# Patient Record
Sex: Male | Born: 1970 | Hispanic: Yes | Marital: Married | Smoking: Former smoker
Health system: Southern US, Community
[De-identification: ages and names within clinical notes are randomized; demographics above are authoritative.]

## PROBLEM LIST (undated history)

## (undated) DIAGNOSIS — B9681 Helicobacter pylori [H. pylori] as the cause of diseases classified elsewhere: Secondary | ICD-10-CM

## (undated) DIAGNOSIS — K4090 Unilateral inguinal hernia, without obstruction or gangrene, not specified as recurrent: Secondary | ICD-10-CM

## (undated) DIAGNOSIS — K219 Gastro-esophageal reflux disease without esophagitis: Secondary | ICD-10-CM

## (undated) DIAGNOSIS — K2970 Gastritis, unspecified, without bleeding: Secondary | ICD-10-CM

## (undated) DIAGNOSIS — I10 Essential (primary) hypertension: Secondary | ICD-10-CM

## (undated) HISTORY — DX: Gastro-esophageal reflux disease without esophagitis: K21.9

## (undated) HISTORY — DX: Helicobacter pylori (H. pylori) as the cause of diseases classified elsewhere: B96.81

## (undated) HISTORY — DX: Gastritis, unspecified, without bleeding: K29.70

---

## 2010-09-26 ENCOUNTER — Emergency Department (HOSPITAL_COMMUNITY)
Admission: EM | Admit: 2010-09-26 | Discharge: 2010-09-27 | Disposition: A | Discharge status: Home / Self Care | Payer: Self-pay | Attending: Emergency Medicine | Admitting: Emergency Medicine

## 2010-09-26 DIAGNOSIS — Z79899 Other long term (current) drug therapy: Secondary | ICD-10-CM | POA: Insufficient documentation

## 2010-09-26 DIAGNOSIS — I10 Essential (primary) hypertension: Secondary | ICD-10-CM | POA: Insufficient documentation

## 2010-09-26 DIAGNOSIS — R079 Chest pain, unspecified: Secondary | ICD-10-CM | POA: Insufficient documentation

## 2010-09-26 LAB — DIFFERENTIAL
Basophils Absolute: 0 10*3/uL (ref 0.0–0.1)
Eosinophils Relative: 2 % (ref 0–5)
Lymphocytes Relative: 16 % (ref 12–46)
Lymphs Abs: 1.6 10*3/uL (ref 0.7–4.0)
Neutro Abs: 7.8 10*3/uL — ABNORMAL HIGH (ref 1.7–7.7)

## 2010-09-26 LAB — CBC
HCT: 45.3 % (ref 39.0–52.0)
Hemoglobin: 16.2 g/dL (ref 13.0–17.0)
MCV: 91.1 fL (ref 78.0–100.0)
RBC: 4.97 MIL/uL (ref 4.22–5.81)
RDW: 12.8 % (ref 11.5–15.5)
WBC: 10.3 10*3/uL (ref 4.0–10.5)

## 2010-09-26 LAB — POCT I-STAT TROPONIN I

## 2010-09-27 LAB — CK TOTAL AND CKMB (NOT AT ARMC)
CK, MB: 2.6 ng/mL (ref 0.3–4.0)
Relative Index: 1.7 (ref 0.0–2.5)
Total CK: 155 U/L (ref 7–232)

## 2010-09-27 LAB — COMPREHENSIVE METABOLIC PANEL
ALT: 52 U/L (ref 0–53)
AST: 38 U/L — ABNORMAL HIGH (ref 0–37)
Albumin: 4.2 g/dL (ref 3.5–5.2)
Alkaline Phosphatase: 64 U/L (ref 39–117)
BUN: 14 mg/dL (ref 6–23)
CO2: 22 mEq/L (ref 19–32)
Calcium: 9.3 mg/dL (ref 8.4–10.5)
Chloride: 106 mEq/L (ref 96–112)
Creatinine, Ser: <0.47 mg/dL — ABNORMAL LOW (ref 0.50–1.35)
Glucose, Bld: 109 mg/dL — ABNORMAL HIGH (ref 70–99)
Potassium: 3.8 mEq/L (ref 3.5–5.1)
Sodium: 138 mEq/L (ref 135–145)
Total Bilirubin: 0.7 mg/dL (ref 0.3–1.2)
Total Protein: 7.2 g/dL (ref 6.0–8.3)

## 2010-09-27 LAB — POCT I-STAT TROPONIN I: Troponin i, poc: 0 ng/mL (ref 0.00–0.08)

## 2010-09-29 ENCOUNTER — Emergency Department (HOSPITAL_COMMUNITY)
Admission: EM | Admit: 2010-09-29 | Discharge: 2010-09-30 | Disposition: A | Discharge status: Home / Self Care | Payer: Self-pay | Attending: Emergency Medicine | Admitting: Emergency Medicine

## 2010-09-29 DIAGNOSIS — R1013 Epigastric pain: Secondary | ICD-10-CM | POA: Insufficient documentation

## 2010-09-29 DIAGNOSIS — K219 Gastro-esophageal reflux disease without esophagitis: Secondary | ICD-10-CM | POA: Insufficient documentation

## 2010-09-29 DIAGNOSIS — K921 Melena: Secondary | ICD-10-CM | POA: Insufficient documentation

## 2010-09-29 DIAGNOSIS — I10 Essential (primary) hypertension: Secondary | ICD-10-CM | POA: Insufficient documentation

## 2010-09-29 LAB — URINALYSIS, ROUTINE W REFLEX MICROSCOPIC
Bilirubin Urine: NEGATIVE
Glucose, UA: NEGATIVE mg/dL
Hgb urine dipstick: NEGATIVE
Ketones, ur: NEGATIVE mg/dL
Nitrite: NEGATIVE
Protein, ur: NEGATIVE mg/dL
Specific Gravity, Urine: 1.019 (ref 1.005–1.030)
Urobilinogen, UA: 0.2 mg/dL (ref 0.0–1.0)
pH: 5.5 (ref 5.0–8.0)

## 2010-09-29 LAB — POCT I-STAT, CHEM 8
Chloride: 105 mEq/L (ref 96–112)
Creatinine, Ser: 0.6 mg/dL (ref 0.50–1.35)
Glucose, Bld: 99 mg/dL (ref 70–99)
HCT: 50 % (ref 39.0–52.0)
Hemoglobin: 17 g/dL (ref 13.0–17.0)
Potassium: 3.8 mEq/L (ref 3.5–5.1)
Sodium: 139 mEq/L (ref 135–145)

## 2010-09-29 LAB — POCT I-STAT TROPONIN I: Troponin i, poc: 0.01 ng/mL (ref 0.00–0.08)

## 2010-09-29 LAB — URINE MICROSCOPIC-ADD ON

## 2010-09-30 LAB — SAMPLE TO BLOOD BANK

## 2010-10-03 ENCOUNTER — Ambulatory Visit: Payer: Self-pay | Admitting: Internal Medicine

## 2011-02-23 ENCOUNTER — Ambulatory Visit: Payer: Self-pay

## 2011-02-23 DIAGNOSIS — F17200 Nicotine dependence, unspecified, uncomplicated: Secondary | ICD-10-CM

## 2011-02-23 DIAGNOSIS — R509 Fever, unspecified: Secondary | ICD-10-CM

## 2011-02-23 DIAGNOSIS — R05 Cough: Secondary | ICD-10-CM

## 2012-02-06 ENCOUNTER — Ambulatory Visit: Payer: Self-pay | Admitting: Family Medicine

## 2012-02-06 VITALS — BP 111/75 | HR 73 | Temp 98.5°F | Resp 16 | Ht 67.0 in | Wt 172.0 lb

## 2012-02-06 DIAGNOSIS — R109 Unspecified abdominal pain: Secondary | ICD-10-CM

## 2012-02-06 DIAGNOSIS — L408 Other psoriasis: Secondary | ICD-10-CM

## 2012-02-06 DIAGNOSIS — L409 Psoriasis, unspecified: Secondary | ICD-10-CM

## 2012-02-06 DIAGNOSIS — R1013 Epigastric pain: Secondary | ICD-10-CM

## 2012-02-06 LAB — GLUCOSE, POCT (MANUAL RESULT ENTRY): POC Glucose: 85 mg/dl (ref 70–99)

## 2012-02-06 MED ORDER — FLUOCINONIDE-E 0.05 % EX CREA: TOPICAL_CREAM | Freq: Two times a day (BID) | CUTANEOUS | Status: DC

## 2012-02-06 MED ORDER — DEXLANSOPRAZOLE 30 MG PO CPDR: 30.0000 mg | DELAYED_RELEASE_CAPSULE | Freq: Every day | ORAL | Status: DC

## 2012-02-06 NOTE — Progress Notes (Signed)
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  He has had nausea but no vomiting.  He has been tested and treated for H.Pylori in past with no improvement.  Objective:  NAD Psoriatic plaque right forearm, left chest  Abdominal exam:  Soft, mildly tender RLQ and epigastrium with umbilical hernia (1 cm), no masses or HSM Results for orders placed in visit on 02/06/12  GLUCOSE, POCT (MANUAL RESULT ENTRY)      Component Value Range   POC Glucose 85  70 - 99 mg/dl     Assessment:  Gastritis  Plan:  Dexilant 1. Abdominal pain  POCT glucose (manual entry)

## 2012-02-09 ENCOUNTER — Ambulatory Visit: Payer: Self-pay | Admitting: Family Medicine

## 2012-02-09 VITALS — BP 106/68 | HR 72 | Temp 98.2°F | Resp 18 | Ht 66.0 in | Wt 166.0 lb

## 2012-02-09 DIAGNOSIS — R51 Headache: Secondary | ICD-10-CM

## 2012-02-09 DIAGNOSIS — J329 Chronic sinusitis, unspecified: Secondary | ICD-10-CM

## 2012-02-09 MED ORDER — HYDROCODONE-ACETAMINOPHEN 5-500 MG PO TABS: 1.0000 | ORAL_TABLET | Freq: Three times a day (TID) | ORAL | Status: DC | PRN

## 2012-02-09 MED ORDER — SULFAMETHOXAZOLE-TRIMETHOPRIM 800-160 MG PO TABS: 1.0000 | ORAL_TABLET | Freq: Two times a day (BID) | ORAL | Status: DC

## 2012-02-09 NOTE — Patient Instructions (Addendum)
Sinusitis   (Sinusitis)  La sinusitis es el enrojecimiento, dolor e hinchazón (inflamación) de los senos paranasales. Los senos paranasales son bolsas de aire que se encuentran dentro de los huesos del rostro (por debajo de los ojos, en la mitad de la frente o por encima de los ojos). En los senos paranasales sanos, el moco es capaz de drenar y el aire circula a través de ellos en su camino hacia la nariz. Sin embargo, cuando se inflaman, el moco y el aire quedan atrapados. Esto hace que se desarrollen bacterias y otros gérmenes y originen una infección.    La sinusitis puede desarrollarse rápidamente y durar sólo un tiempo corto (aguda) o continuar por un período largo (crónica).. La sinusitis que dura más de 12 semanas se considera crónica.   CAUSAS   Las causas de la sinusitis son:   · Alergias.  · Las anomalías estructurales, como el desplazamiento del cartílago que separa las fosas nasales (desvío del tabique) pueden disminuir el flujo de aire por la nariz y los senos paranasales y afectar su drenaje.  · Las alteraciones funcionales, como cuando los pequeños pelos (cilias) que se encuentran en los senos nasales y ayudan a eliminar la mucosidad no funcionan correctamente o no están presentes.  SÍNTOMAS   Los síntomas de sinusitis aguda y crónica son los mismos. Los síntomas principales son el dolor y la presión alrededor de los senos paranasales afectados. Otros síntomas son:   · Dolor en los dientes superiores.  · Dolor de oídos  · Dolor de cabeza.  · Mal aliento.  · Disminución del sentido del olfato y del gusto.  · Tos, que empeora al acostarse.  · Fatiga.  · Fiebre.  · Drenaje de moco espeso por la nariz, que generalmente es de color verde y puede contener pus (purulento).  · Hinchazón y calor en los senos paranasales afectados.  DIAGNÓSTICO   El médico le hará un examen físico. Durante el examen, el médico:   · Revisará su nariz buscando signos de crecimientos anormales en las fosas nasales (pólipos  nasales).  · Palpará los senos paranasales afectados para buscar signos de infección.  · Observará el interior de los senos paranasales (endoscopía) con un dispositivo luminoso especial (endoscopio) con el que tomará imágenes insertándolo en los senos paranasales.  Si el médico sospecha que usted sufre sinusitis crónica, podrá indicar una o más de las siguientes pruebas:   · Pruebas de alergia.  · Cultivo de secreciones nasales: tomará una muestra del moco nasal y lo enviará a un laboratorio para detectar bacterias.  · Citología nasal: el médico tomará una muestra de moco de la nariz para determinar si la sinusitis que usted sufre está relacionada con una alergia.  TRATAMIENTO   La mayoría de los casos de sinusitis aguda se deben a una infección viral y se resuelven espontáneamente dentro de los 10 días. En algunos casos se recetan medicamentos para aliviar los síntomas (analgésicos, descongestivos, aerosoles nasales con corticoides o aerosoles salinos).   Sin embargo, para la sinusitis por infección bacteriana, el médico le recetará antibióticos. Los antibióticos son medicamentos que destruyen las bacterias que causan la infección.   Rara vez la sinusitis tiene su origen en una infección por hongos. . En estos casos, el médico le recetará un medicamento antifúngico.   Para algunos casos de sinusitis crónica, es necesario someterse a una cirugía. Generalmente se trata de casos en los que la sinusitis se repite más de 3 veces al año, a pesar de   otros tratamientos.   INSTRUCCIONES PARA EL CUIDADO EN EL HOGAR   · Beba gran cantidad de líquidos. Los líquidos ayudan a disolver el moco para que drene más fácilmente de los senos paranasales.  · Use un humidificador.  · Inhale vapor de 3 a 4 veces al día (por ejemplo, siéntese en el baño con la ducha abierta).  · Aplique un paño tibio y húmedo en el rostro 3 ó 4 veces al día, o según las indicaciones de su médico.  · Use un aerosol nasal salino para ayudar a humedecer y  limpiar los senos nasales.  · Tome medicamentos de venta libre o recetados para el aliviar el dolor, el malestar o la fiebre sólo según las indicaciones de su médico.  SOLICITE ATENCIÓN MÉDICA DE INMEDIATO SI:   · Siente más dolor o sufre dolores de cabeza intensos.  · Tiene náuseas, vómitos o somnolencia.  · Observa hinchazón alrededor del rostro.  · Tiene problemas de visión.  · Presenta rigidez en el cuello.  · Tiene dificultad para respirar.  ASEGÚRESE DE QUE:   · Comprende estas instrucciones.  · Controlará su enfermedad.  · Solicitará ayuda de inmediato si no mejora o si empeora.  Document Released: 10/23/2004 Document Revised: 04/07/2011  Exit[employer]® Patient Information ©2013 [employer].

## 2012-02-09 NOTE — Progress Notes (Signed)
Patient ID: Tran Arzuaga MRN: [id number], DOB: [birth year], 42 y.o. Date of Encounter: 02/09/2012, 7:13 PM  Primary Physician: No primary provider on file.  Chief Complaint:  Chief Complaint  Patient presents with  . Headache    3 days ago  . Neck Pain    HPI: 42 y.o. year old male presents with 14 day history of nasal congestion, post nasal drip, sore throat, sinus pressure, and cough. Afebrile. No chills. Nasal congestion thick and green/yellow. Sinus pressure is the worst symptom. Cough is productive secondary to post nasal drip and not associated with time of day. Ears feel full, leading to sensation of muffled hearing. Has tried OTC cold preps without success. No GI complaints. Also has bad breath  No recent antibiotics, recent travels, or sick contacts   No leg trauma, sedentary periods, h/o cancer, or tobacco use.  No past medical history on file.   Home Meds: Prior to Admission medications   Medication Sig Start Date End Date Taking? Authorizing Provider  Dexlansoprazole (DEXILANT) 30 MG capsule Take 1 capsule (30 mg total) by mouth daily. 02/06/12  Yes Elvina Sidle, MD  fluocinonide-emollient (LIDEX-E) 0.05 % cream Apply topically 2 (two) times daily. 02/06/12  Yes Elvina Sidle, MD  HYDROcodone-acetaminophen (VICODIN) 5-500 MG per tablet Take 1 tablet by mouth every 8 (eight) hours as needed for pain. 02/09/12   Elvina Sidle, MD  sulfamethoxazole-trimethoprim (BACTRIM DS,SEPTRA DS) 800-160 MG per tablet Take 1 tablet by mouth 2 (two) times daily. 02/09/12   Elvina Sidle, MD    Allergies: No Known Allergies  History   Social History  . Marital Status: Married    Spouse Name: N/A    Number of Children: N/A  . Years of Education: N/A   Occupational History  . Not on file.   Social History Main Topics  . Smoking status: Current Every Day Smoker -- 0.5 packs/day for 10 years  . Smokeless tobacco: Not on file  . Alcohol Use: Yes  . Drug Use: No  .  Sexually Active: Not on file   Other Topics Concern  . Not on file   Social History Narrative  . No narrative on file     Review of Systems: Constitutional: negative for chills, fever, night sweats or weight changes Cardiovascular: negative for chest pain or palpitations Respiratory: negative for hemoptysis, wheezing, or shortness of breath Abdominal: negative for abdominal pain, nausea, vomiting or diarrhea Dermatological: negative for rash Neurologic: positive for pressure headache and neck pain with some chronic swollen post cervical cyst like swellings   Physical Exam: Blood pressure 106/68, pulse 72, temperature 98.2 F (36.8 C), temperature source Oral, resp. rate 18, height 5\' 6"  (1.676 m), weight 166 lb (75.297 kg), SpO2 97.00%., Body mass index is 26.79 kg/(m^2). General: Well developed, well nourished, in no acute distress. Head: Normocephalic, atraumatic, eyes without discharge, sclera non-icteric, nares are congested. Bilateral auditory canals clear, TM's are without perforation, pearly grey with reflective cone of light bilaterally. Serous effusion bilaterally behind TM's. Maxillary sinus TTP. Oral cavity moist, dentition normal. Posterior pharynx with post nasal drip and mild erythema. No peritonsillar abscess or tonsillar exudate. Neck: Supple. No thyromegaly. Full ROM. No lymphadenopathy. Lungs: Clear bilaterally to auscultation without wheezes, rales, or rhonchi. Breathing is unlabored.  Heart: RRR with S1 S2. No murmurs, rubs, or gallops appreciated. Msk:  Strength and tone normal for age. Extremities: No clubbing or cyanosis. No edema. Neuro: Alert and oriented X 3. Moves all extremities spontaneously. CNII-XII grossly  in tact. Psych:  Responds to questions appropriately with a normal affect.   Labs:   ASSESSMENT AND PLAN:  42 y.o. year old male with sinusitis Septra ds bid x 10 days vicodin for hs pain -  -Tylenol/Motrin prn -Rest/fluids -RTC  precautions -RTC 3-5 days if no improvement  Signed, Elvina Sidle, MD 02/09/2012 7:13 PM

## 2012-02-20 ENCOUNTER — Encounter: Payer: Self-pay | Admitting: Internal Medicine

## 2012-02-20 ENCOUNTER — Telehealth: Payer: Self-pay

## 2012-03-15 ENCOUNTER — Encounter: Payer: Self-pay | Admitting: Internal Medicine

## 2012-03-16 ENCOUNTER — Encounter: Payer: Self-pay | Admitting: Internal Medicine

## 2012-03-16 ENCOUNTER — Other Ambulatory Visit (INDEPENDENT_AMBULATORY_CARE_PROVIDER_SITE_OTHER): Payer: Self-pay

## 2012-03-16 ENCOUNTER — Ambulatory Visit (INDEPENDENT_AMBULATORY_CARE_PROVIDER_SITE_OTHER): Payer: Self-pay | Admitting: Internal Medicine

## 2012-03-16 ENCOUNTER — Other Ambulatory Visit: Payer: Self-pay | Admitting: Internal Medicine

## 2012-03-16 VITALS — BP 132/94 | HR 76 | Ht 66.14 in | Wt 171.5 lb

## 2012-03-16 DIAGNOSIS — L409 Psoriasis, unspecified: Secondary | ICD-10-CM

## 2012-03-16 DIAGNOSIS — R17 Unspecified jaundice: Secondary | ICD-10-CM

## 2012-03-16 DIAGNOSIS — R110 Nausea: Secondary | ICD-10-CM

## 2012-03-16 DIAGNOSIS — Z8619 Personal history of other infectious and parasitic diseases: Secondary | ICD-10-CM

## 2012-03-16 DIAGNOSIS — L408 Other psoriasis: Secondary | ICD-10-CM

## 2012-03-16 DIAGNOSIS — R1013 Epigastric pain: Secondary | ICD-10-CM | POA: Insufficient documentation

## 2012-03-16 DIAGNOSIS — R109 Unspecified abdominal pain: Secondary | ICD-10-CM

## 2012-03-16 DIAGNOSIS — K3189 Other diseases of stomach and duodenum: Secondary | ICD-10-CM

## 2012-03-16 LAB — COMPREHENSIVE METABOLIC PANEL
AST: 29 U/L (ref 0–37)
Alkaline Phosphatase: 61 U/L (ref 39–117)
BUN: 12 mg/dL (ref 6–23)
Creatinine, Ser: 0.6 mg/dL (ref 0.4–1.5)
Glucose, Bld: 95 mg/dL (ref 70–99)
Total Bilirubin: 2.7 mg/dL — ABNORMAL HIGH (ref 0.3–1.2)

## 2012-03-16 LAB — CBC
HCT: 50.1 % (ref 39.0–52.0)
MCHC: 33.9 g/dL (ref 30.0–36.0)
MCV: 93.7 fl (ref 78.0–100.0)
Platelets: 162 10*3/uL (ref 150.0–400.0)
RBC: 5.35 Mil/uL (ref 4.22–5.81)

## 2012-03-16 LAB — LIPASE: Lipase: 23 U/L (ref 11.0–59.0)

## 2012-03-16 MED ORDER — FLUOCINONIDE-E 0.05 % EX CREA: TOPICAL_CREAM | Freq: Two times a day (BID) | CUTANEOUS | Status: DC

## 2012-03-16 MED ORDER — SUCRALFATE 1 G PO TABS: 1.0000 g | ORAL_TABLET | Freq: Four times a day (QID) | ORAL | Status: DC

## 2012-03-16 MED ORDER — DEXLANSOPRAZOLE 30 MG PO CPDR: 60.0000 mg | DELAYED_RELEASE_CAPSULE | Freq: Every day | ORAL | Status: DC

## 2012-03-16 NOTE — Progress Notes (Signed)
Patient ID: Caleb Pace, male   DOB: 1970/03/05, 42 y.o.   MRN: [id number]  SUBJECTIVE: HPI Caleb Pace is a 42 yo Hispanic male with a past medical history of GERD and Helicobacter pylori who seen in consultation for evaluation of epigastric abdominal pain, nausea and heartburn. He is accompanied today by his daughter who helps with Spanish translation and also a medical interpreter.  The patient reports 4 months of epigastric abdominal pain which is moderate to at times severe. Occasionally he reports pain in the left upper quadrant. This seems to be worse in the morning, and then one to 2 hours after eating. He feels that eating briefly helps with the burning pain, but it returns after an hour or so. He was started on Dexilant 30 mg daily approximately one month ago, which he has been taking as prescribed. He's had no benefit with the addition of this medication. He notes significant nausea but no vomiting.  He does recall a history of H. pylori diagnosed 2 years ago by blood test and he was treated with antibiotics for 2 weeks. He reports his current symptoms do not feel exactly like his symptoms around the time of treatment.  The difference is the quality of his current pain is more burning than before. Occasionally he is using baking soda with water which he says helps. He does have some nocturnal symptoms. He also endorses early satiety with an approximate 10 pound weight loss. There is some report of possible black stools about 2 weeks ago but this resolved. Occasionally he feels like food sticks as he swallows he said no food impaction. He has no trouble swallowing liquids. No fevers or chills. No frequent NSAID use.  Review of Systems  As per history of present illness, otherwise negative   Past Medical History  Diagnosis Date  . GERD (gastroesophageal reflux disease)   . Helicobacter pylori gastritis     Current Outpatient Prescriptions  Medication Sig Dispense Refill  .  Dexlansoprazole (DEXILANT) 30 MG capsule Take 2 capsules (60 mg total) by mouth daily.  30 capsule  10  . fluocinonide-emollient (LIDEX-E) 0.05 % cream Apply topically 2 (two) times daily.  60 g  6  . sucralfate (CARAFATE) 1 G tablet Take 1 tablet (1 g total) by mouth 4 (four) times daily.  120 tablet  1   No current facility-administered medications for this visit.    Allergies  Allergen Reactions  . Asa (Aspirin)     Family History  Problem Relation Age of Onset  . Family history unknown: Yes    History  Substance Use Topics  . Smoking status: Current Every Day Smoker -- 0.50 packs/day for 10 years    Types: Cigarettes  . Smokeless tobacco: Never Used  . Alcohol Use: Yes    OBJECTIVE: BP 132/94  Pulse 76  Ht 5' 6.14" (1.68 m)  Wt 171 lb 8 oz (77.792 kg)  BMI 27.56 kg/m2 Constitutional: Well-developed and well-nourished. No distress. HEENT: Normocephalic and atraumatic. Oropharynx is clear and moist. No oropharyngeal exudate. Conjunctivae are normal. No scleral icterus. Neck: Neck supple. Trachea midline. Cardiovascular: Normal rate, regular rhythm and intact distal pulses. No M/R/G Pulmonary/chest: Effort normal and breath sounds normal. No wheezing, rales or rhonchi. Abdominal: Soft,  Epigastric abdominal tenderness without rebound or guarding , nondistended. Bowel sounds active throughout. There are no masses palpable. No hepatosplenomegaly. Extremities: no clubbing, cyanosis, or edema Lymphadenopathy: No cervical adenopathy noted. Neurological: Alert and oriented to person place and time. Skin:  Skin is warm and dry. No rashes noted. Psychiatric: Normal mood and affect. Behavior is normal.  Labs - ordered today  ASSESSMENT AND PLAN: 42 yo Hispanic male with a past medical history of GERD and Helicobacter pylori who seen in consultation for evaluation of epigastric abdominal pain, nausea and heartburn.   1.  Ulcer-like dyspepsia/epigastric pain/nausea -- the  patient's symptoms have not responded to Dexilant 30 mg daily, and I recommended upper endoscopy for further evaluation. His symptoms could be consistent with ulcer disease, but I would also like to rule out recurrent H. pylori infection. I have increased his Dexilant 60 mg daily and add Carafate 1 g 3 times a day a.c. and at bedtime. I've asked that he continue to avoid NSAIDs. He voices understanding. Further recommendations to be made after the upper endoscopy.  2.  Psoriasis -- he requests a refill of Lidex-E cream, this will be refilled once for him to further refills should come from his primary care or dermatologist.

## 2012-03-16 NOTE — Patient Instructions (Addendum)
You have been given a separate informational sheet regarding your tobacco use, the importance of quitting and local resources to help you quit. You have been scheduled for an endoscopy with propofol. Please follow written instructions given to you at your visit today. If you use inhalers (even only as needed) or a CPAP machine, please bring them with you on the day of your procedure.   We have sent the following medications to your pharmacy for you to pick up at your convenience: Dexilant; please take 60mg  until your procedure.  Carafate; please take as directed.  AINES y lceras ppticas (NSAIDs and Peptic Ulcers) Una lcera pptica es un defecto que se forma en el revestimiento del estmago o la primera porcin del intestino delgado.  CAUSAS La causa de la mayor parte de las lceras ppticas es la bacteria H. Pylori (Helicobacter pylori). Algunas lceras ppticas estn causada por el uso prolongado de AINES (drogas antiinflamatorias no esteroideas). Entre los AINES se incluyen la aspirina, el ibuprofeno y el naproxeno sodico. SNTOMAS Una lcera puede ocasionar:  Dolor y ardor persistente en la regin superior del abdomen.  Nuseas.  Vmitos.  Prdida del apetito.  Prdida de peso.  Fatiga. Normalmente el estmago tiene tres defensas contra los jugos digestivos:   El mucus que recubre las paredes del Teaching laboratory technician y lo protege del cido Data processing manager.  El bicarbonato qumico que neutraliza el cido Data processing manager.  La circulacin sangunea hacia las paredes del estmago que ayudan en la renovacin y reparacin de clulas. Las AINES entorpecen todos estos mecanismos protectores. Con las defensas del [address], los jugos digestivos pueden daar las paredes del [place] y Counsellor. TRATAMIENTO  Las lceras inducidas por AINES suelen curarse una vez que la persona deja de tomar la medicacin. El mdico podr recomendarle tomar anticidos para Hydrologist. Los anticidos  [employer] de curacin y [address] sntomas. Las drogas denominadas bloqueadores H2 o los inhibidores de la bomba de protones disminuyen la cantidad de cido del [place].  Si una persona con una lcera poe AINEs tambin da positivo para el H. pylori, deber tratarse con antibiticos para eliminar la bacteria.  Podr ser Lois Huxley ciruga si la lcera reaparece o no se cura bien.  Tambin se puede requerir ciruga si aparecen complicaciones como:  Hemorragia grave.  Perforacin.  Obstruccin.  Cualquier persona que consume AINES y experimenta sntomas de lcera pptica deber consultar con el mdico para realizar el tratamiento adecuado. El retardo del diagnstico y el tratamiento puede llevar a complicaciones y a la necesidad de [place]. [facility] ADICIONAL [employer] Digestive Diseases Information Clearinghouse [facility] para la Informacin Bonnie Digestivas): http://[email or web address].co.[email or web address] Document Released: 05/01/2008 Document Revised: 04/07/2011 [facility] Patient Information 2013 [place], [place].

## 2012-03-18 ENCOUNTER — Telehealth: Payer: Self-pay | Admitting: Internal Medicine

## 2012-03-18 ENCOUNTER — Ambulatory Visit (HOSPITAL_COMMUNITY)
Admission: RE | Admit: 2012-03-18 | Discharge: 2012-03-18 | Disposition: A | Discharge status: Home / Self Care | Payer: Self-pay | Source: Ambulatory Visit | Attending: Internal Medicine | Admitting: Internal Medicine

## 2012-03-18 DIAGNOSIS — R110 Nausea: Secondary | ICD-10-CM | POA: Insufficient documentation

## 2012-03-18 DIAGNOSIS — R17 Unspecified jaundice: Secondary | ICD-10-CM | POA: Insufficient documentation

## 2012-03-18 NOTE — Telephone Encounter (Addendum)
Informed pt of normal u/s. His niece called and I informed her he needs to sign papers so I can speak with her. He was sitting next to her and I informed him everything is OK and she will tell him in [place]. She/he wants to know about the liver, since the test was OK. Informed her sometimes the lab will clear up on another blood draw, but the U/S showed everything was OK. She will remind him of his procedure on 03/22/12.

## 2012-03-22 ENCOUNTER — Telehealth: Payer: Self-pay | Admitting: Internal Medicine

## 2012-03-22 ENCOUNTER — Other Ambulatory Visit (INDEPENDENT_AMBULATORY_CARE_PROVIDER_SITE_OTHER): Payer: Self-pay

## 2012-03-22 ENCOUNTER — Ambulatory Visit (AMBULATORY_SURGERY_CENTER): Payer: Self-pay | Admitting: Internal Medicine

## 2012-03-22 ENCOUNTER — Encounter: Payer: Self-pay | Admitting: Internal Medicine

## 2012-03-22 VITALS — BP 120/80 | HR 62 | Resp 15 | Ht 66.0 in | Wt 171.0 lb

## 2012-03-22 DIAGNOSIS — K2970 Gastritis, unspecified, without bleeding: Secondary | ICD-10-CM

## 2012-03-22 DIAGNOSIS — L409 Psoriasis, unspecified: Secondary | ICD-10-CM

## 2012-03-22 DIAGNOSIS — R110 Nausea: Secondary | ICD-10-CM

## 2012-03-22 DIAGNOSIS — R109 Unspecified abdominal pain: Secondary | ICD-10-CM

## 2012-03-22 DIAGNOSIS — R1013 Epigastric pain: Secondary | ICD-10-CM

## 2012-03-22 DIAGNOSIS — A048 Other specified bacterial intestinal infections: Secondary | ICD-10-CM

## 2012-03-22 DIAGNOSIS — Z8619 Personal history of other infectious and parasitic diseases: Secondary | ICD-10-CM

## 2012-03-22 DIAGNOSIS — K2990 Gastroduodenitis, unspecified, without bleeding: Secondary | ICD-10-CM

## 2012-03-22 LAB — HEPATIC FUNCTION PANEL
ALT: 32 U/L (ref 0–53)
AST: 21 U/L (ref 0–37)
Bilirubin, Direct: 0.2 mg/dL (ref 0.0–0.3)
Total Protein: 6.5 g/dL (ref 6.0–8.3)

## 2012-03-22 MED ORDER — DEXLANSOPRAZOLE 60 MG PO CPDR: 60.0000 mg | DELAYED_RELEASE_CAPSULE | Freq: Every day | ORAL | Status: DC

## 2012-03-22 MED ORDER — FLUOCINONIDE-E 0.05 % EX CREA
TOPICAL_CREAM | Freq: Two times a day (BID) | CUTANEOUS | Status: DC
Start: 2012-03-22 — End: 2012-04-06

## 2012-03-22 MED ORDER — SODIUM CHLORIDE 0.9 % IV SOLN: 500.0000 mL | INTRAVENOUS | Status: DC

## 2012-03-22 MED ORDER — SUCRALFATE 1 G PO TABS: 1.0000 g | ORAL_TABLET | Freq: Four times a day (QID) | ORAL | Status: DC

## 2012-03-22 NOTE — Op Note (Signed)
[facility] [address] [place], [address]   ENDOSCOPY PROCEDURE REPORT  PATIENT: Caleb Pace, Caleb Pace  MR#: [id number] BIRTHDATE: Oct 20, 1970 , 42  yrs. old GENDER: Male ENDOSCOPIST: Beverley Fiedler, MD PROCEDURE DATE:  03/22/2012 PROCEDURE:  [Referring Physician] ASA CLASS:     Class II INDICATIONS:  Nausea.   Epigastric pain.   Dyspepsia. MEDICATIONS: MAC sedation, administered by CRNA, propofol (Diprivan) 200mg  IV, and Glycopyrrolate (Robinul) 0.2 mg IV TOPICAL ANESTHETIC: Cetacaine Spray  DESCRIPTION OF PROCEDURE: After the risks benefits and alternatives of the procedure were thoroughly explained, informed consent was obtained.  The LB GIF-H180 [id number] endoscope was introduced through the mouth and advanced to the second portion of the duodenum. Without limitations.  The instrument was slowly withdrawn as the mucosa was fully examined.     ESOPHAGUS: An irregular Z-line was observed 40 cm from the incisors. Multiple biopsies were performed using cold forceps.  Sample obtained to rule out Barrett's esophagus.   The esophagus was otherwise normal.  STOMACH: Mild gastritis (inflammation) with scattered erosions was found in the entire examined stomach.  Multiple biopsies were performed using cold forceps.  Sample obtained for helicobacter pylori testing.  DUODENUM: Moderate duodenal inflammation characterized by erythema and edema was found in the duodenal bulb.   The duodenal mucosa showed no abnormalities in the 2nd part of the duodenum.  Retroflexed views revealed no abnormalities.     The scope was then withdrawn from the patient and the procedure completed.  COMPLICATIONS: There were no complications.  ENDOSCOPIC IMPRESSION: 1.   Irregular Z-line was observed 40 cm from the incisors; multiple biopsies 2.   The esophagus was otherwise normal. 3.   Gastritis (inflammation) was found in the entire examined stomach; multiple biopsies 4.   Duodenal  inflammation was found in the duodenal bulb 5.   The duodenal mucosa showed no abnormalities in the 2nd part of the duodenum     RECOMMENDATIONS: 1.  Await pathology results 2.  Follow-up of helicobacter pylori status, treat if indicated 3.  Avoid NSAIDs 4.  Continue Dexilant 60 mg daily 5.  Start Carafate 1 gram three times daily before meals and at bedtime  eSigned:  Beverley Fiedler, MD 03/22/2012 10:11 AM   CC:The Patient  PATIENT NAME:  Caleb Pace, Caleb Pace MR#: [id number]

## 2012-03-22 NOTE — Progress Notes (Addendum)
Patient did not experience any of the following events: a burn prior to discharge; a fall within the facility; wrong site/side/patient/procedure/implant event; or a hospital transfer or hospital admission upon discharge from the facility. ([id number])Patient did not have preoperative order for IV antibiotic SSI prophylaxis. [phone]) ewm  Pt to go to lab prior to home per orders dr pyrtle. ewm

## 2012-03-22 NOTE — Telephone Encounter (Signed)
Pt had called [facility] pharmacy and his prescription was not there. Medicines resent to [facility] as pt changed the pharmacy at the end of his procedure today in recovery and pt called and made aware resent medications and they should be ready in several hours. Pt returned verbal understanding of this. ewm

## 2012-03-22 NOTE — Patient Instructions (Addendum)
YOU HAD AN ENDOSCOPIC PROCEDURE TODAY AT [facility]: Refer to the procedure report that was given to you for any specific questions about what was found during the examination.  If the procedure report does not answer your questions, please call your gastroenterologist to clarify.  If you requested that your care partner not be given the details of your procedure findings, then the procedure report has been included in a sealed envelope for you to review at your convenience later.  YOU SHOULD EXPECT: Some feelings of bloating in the abdomen. Passage of more gas than usual.  Walking can help get rid of the air that was put into your GI tract during the procedure and reduce the bloating. If you had a lower endoscopy (such as a colonoscopy or flexible sigmoidoscopy) you may notice spotting of blood in your stool or on the toilet paper. If you underwent a bowel prep for your procedure, then you may not have a normal bowel movement for a few days.  DIET: Your first meal following the procedure should be a light meal and then it is ok to progress to your normal diet.  A half-sandwich or bowl of soup is an example of a good first meal.  Heavy or fried foods are harder to digest and may make you feel nauseous or bloated.  Likewise meals heavy in dairy and vegetables can cause extra gas to form and this can also increase the bloating.  Drink plenty of fluids but you should avoid alcoholic beverages for 24 hours.  ACTIVITY: Your care partner should take you home directly after the procedure.  You should plan to take it easy, moving slowly for the rest of the day.  You can resume normal activity the day after the procedure however you should NOT DRIVE or use heavy machinery for 24 hours (because of the sedation medicines used during the test).    SYMPTOMS TO REPORT IMMEDIATELY: A gastroenterologist can be reached at any hour.  During normal business hours, 8:30 AM to 5:00 PM Monday through Friday,  call [phone].  After hours and on weekends, please call the GI answering service at [phone] emergency number  who will take a message and have the physician on call contact you.   Following upper endoscopy (EGD)  Vomiting of blood or coffee ground material  New chest pain or pain under the shoulder blades  Painful or persistently difficult swallowing  New shortness of breath  Fever of 100F or higher  Black, tarry-looking stools  FOLLOW UP: If any biopsies were taken you will be contacted by phone or by letter within the next 1-3 weeks.  Call your gastroenterologist if you have not heard about the biopsies in 3 weeks.  Our staff will call the home number listed on your records the next business day following your procedure to check on you and address any questions or concerns that you may have at that time regarding the information given to you following your procedure. This is a courtesy call and so if there is no answer at the home number and we have not heard from you through the emergency physician on call, we will assume that you have returned to your regular daily activities without incident.  SIGNATURES/CONFIDENTIALITY: You and/or your care partner have signed paperwork which will be entered into your electronic medical record.  These signatures attest to the fact that that the information above on your After Visit Summary has been reviewed and is understood.  Full responsibility of the confidentiality of this discharge information lies with you and/or your care-partner.  Avoid all NSAIDS like motrin, aleve, advil or any prescription medicines that are NSAIDS Continue dexilant 60 mg daily Start carafate 1 gram 3 times a day before meals and at bedtime.

## 2012-03-23 ENCOUNTER — Telehealth: Payer: Self-pay | Admitting: *Deleted

## 2012-03-23 NOTE — Telephone Encounter (Signed)
  Follow up Call-  Call back number 03/22/2012  Post procedure Call Back phone  # [phone]  Permission to leave phone message Yes     Patient questions:  Do you have a fever, pain , or abdominal swelling? no Pain Score  0 *  Have you tolerated food without any problems? yes  Have you been able to return to your normal activities? yes  Do you have any questions about your discharge instructions: Diet   no Medications  no Follow up visit  no  Do you have questions or concerns about your Care? no  Actions: * If pain score is 4 or above: No action needed, pain <4.

## 2012-03-23 NOTE — Telephone Encounter (Signed)
Message copied by Florene Glen on Tue Mar 23, 2012  6:04 PM ------      Message from: Beverley Fiedler      Created: Mon Mar 22, 2012  3:17 PM       Bilirubin improved from late last week, nearly normal now.  Unclear reason for elevation, with normal liver and GB US last week.      Repeat hepatic function panel in 1 month to ensure complete normalization ------

## 2012-03-23 NOTE — Telephone Encounter (Signed)
Informed pt of improved Bilirubin; not sure if he understood, so he will have his niece call me. We will repeat labs in 1 month.

## 2012-03-24 ENCOUNTER — Telehealth: Payer: Self-pay | Admitting: *Deleted

## 2012-03-24 DIAGNOSIS — R17 Unspecified jaundice: Secondary | ICD-10-CM

## 2012-03-24 NOTE — Telephone Encounter (Signed)
Informed niece who speaks English of Bilirubin improvement. I will call her in 1 month to redraw hepatic panel to ensure bilirubin remains normal; [place] stated understanding. Reminder in.

## 2012-03-29 ENCOUNTER — Encounter: Payer: Self-pay | Admitting: Internal Medicine

## 2012-03-30 ENCOUNTER — Telehealth: Payer: Self-pay | Admitting: *Deleted

## 2012-03-30 NOTE — Telephone Encounter (Signed)
Letter from: Beverley Fiedler   Reason for Letter: Results Review   H. pylori positive, please treat with Pylera.  When you call the patient he will need Spanish interpreter assistance to be sure he understands the recommendations/medications.  PPI needs to eat twice daily during H. pylori treatment unless he is on Dexilant 60 mg daily.  Office followup 1 month after treatment      Let pt's niece, Lorie Phenix [phone], of pt's infection. We are trying to get him samples from the drug rep, but when I get them, I will call the pt with a Translator.

## 2012-03-31 MED ORDER — BIS SUBCIT-METRONID-TETRACYC 140-125-125 MG PO CAPS: 3.0000 | ORAL_CAPSULE | Freq: Three times a day (TID) | ORAL | Status: DC

## 2012-03-31 NOTE — Telephone Encounter (Signed)
Obtained an interpreter from, [phone], and called pt. Informed him about H.Pylori and the need for antibiotics. Gave instructions for the meds and an appt for April. He will call for problems or questions.

## 2012-04-01 NOTE — Telephone Encounter (Signed)
Niece called back and was informed. Pt later dx with H. Pylori and is being tx.

## 2012-04-06 ENCOUNTER — Telehealth: Payer: Self-pay | Admitting: Internal Medicine

## 2012-04-06 ENCOUNTER — Telehealth: Payer: Self-pay | Admitting: Cardiovascular Disease

## 2012-04-06 ENCOUNTER — Encounter: Payer: Self-pay | Admitting: Internal Medicine

## 2012-04-06 ENCOUNTER — Encounter (HOSPITAL_COMMUNITY): Payer: Self-pay | Admitting: Emergency Medicine

## 2012-04-06 ENCOUNTER — Emergency Department (HOSPITAL_COMMUNITY)
Admission: EM | Admit: 2012-04-06 | Discharge: 2012-04-06 | Disposition: A | Discharge status: Home / Self Care | Payer: Self-pay | Attending: Emergency Medicine | Admitting: Emergency Medicine

## 2012-04-06 ENCOUNTER — Emergency Department (HOSPITAL_COMMUNITY): Payer: Self-pay

## 2012-04-06 ENCOUNTER — Ambulatory Visit (INDEPENDENT_AMBULATORY_CARE_PROVIDER_SITE_OTHER): Payer: Self-pay | Admitting: Internal Medicine

## 2012-04-06 VITALS — BP 110/78 | HR 76 | Ht 66.0 in | Wt 172.0 lb

## 2012-04-06 DIAGNOSIS — R002 Palpitations: Secondary | ICD-10-CM | POA: Insufficient documentation

## 2012-04-06 DIAGNOSIS — Z79899 Other long term (current) drug therapy: Secondary | ICD-10-CM | POA: Insufficient documentation

## 2012-04-06 DIAGNOSIS — R42 Dizziness and giddiness: Secondary | ICD-10-CM

## 2012-04-06 DIAGNOSIS — K219 Gastro-esophageal reflux disease without esophagitis: Secondary | ICD-10-CM

## 2012-04-06 DIAGNOSIS — A048 Other specified bacterial intestinal infections: Secondary | ICD-10-CM

## 2012-04-06 DIAGNOSIS — F17200 Nicotine dependence, unspecified, uncomplicated: Secondary | ICD-10-CM | POA: Insufficient documentation

## 2012-04-06 LAB — CBC
HCT: 45.3 % (ref 39.0–52.0)
MCV: 91.5 fL (ref 78.0–100.0)
RBC: 4.95 MIL/uL (ref 4.22–5.81)
WBC: 7 10*3/uL (ref 4.0–10.5)

## 2012-04-06 LAB — BASIC METABOLIC PANEL
BUN: 10 mg/dL (ref 6–23)
CO2: 23 mEq/L (ref 19–32)
Chloride: 104 mEq/L (ref 96–112)
Creatinine, Ser: 0.79 mg/dL (ref 0.50–1.35)

## 2012-04-06 LAB — POCT I-STAT TROPONIN I: Troponin i, poc: 0 ng/mL (ref 0.00–0.08)

## 2012-04-06 MED ORDER — ZOLPIDEM TARTRATE 5 MG PO TABS: 5.0000 mg | ORAL_TABLET | Freq: Every evening | ORAL | Status: DC | PRN

## 2012-04-06 MED ORDER — OMEPRAZOLE 40 MG PO CPDR: 40.0000 mg | DELAYED_RELEASE_CAPSULE | Freq: Every day | ORAL | Status: DC

## 2012-04-06 MED ORDER — CLARITHROMYCIN 250 MG PO TABS: 250.0000 mg | ORAL_TABLET | Freq: Two times a day (BID) | ORAL | Status: DC

## 2012-04-06 MED ORDER — AMOXICILLIN-POT CLAVULANATE ER 1000-62.5 MG PO TB12: 2.0000 | ORAL_TABLET | Freq: Two times a day (BID) | ORAL | Status: DC

## 2012-04-06 NOTE — Telephone Encounter (Signed)
Pt walked in to report he is having chest pain and a headache and states it's d/t the Pylera. He started the pylera on 04/01/12 and states her did OK the 1st few days and then he developed the pain. Tried to have him understand, if he calls me I can get an interpreter and call him back, this way I got one of our employees to translate and she is not certified. Pt reports he has burning in his chest and points to the mid line or sternum. He then pointed to his left arm and up his throat on the left side and to his left jaw and left side of his face. Informed him these are classic s&s of a heart attack, but he stated he had a good EKG a year ago. Informed him that didn't count and I stressed to him he needed to go to the ER. Pt states he's had H. Pylori before and he knows that this is what this is. When asked about a PPI, he stated the Dexilant made him worse'; can't even drink water when the burning is present. I was confused as to when the pain starts; after meals before drug or vice versa?  Spoke with Dr Margretta Sidle who stressed the need for to go to the ER. After the ER visit, have him stop the Dexilant, start prilosec 40 mg bid and start Clarithromycin and Amoxicillin. Pt kind of questioned whether I was telling him to do this or a doctor and I offered him an appt this PM which he took.  I tried to find UC notes and found [facility] notes, but he said it was [facility]. Called Ernesto Rutherford and Britta Mccreedy will fax [place] the notes from 09/08/10 where pt was tx for H P with Metronidazole, Tetracycline and Pepto Bismol. He then went to the ER for Black stools and was given Doxycycline. She will fax this to [place]. Again, when the pt left, I encouraged him to go to the ER.

## 2012-04-06 NOTE — Telephone Encounter (Signed)
Dr pyrtle's  office requesting appt this week for new pt, having palpatations and dizziness for several days, [facility] dod wed, nishan thurs, and [employer], there are no held dod slots for any of these doc's, pls call stacy at dr pyrtle's office with date and time of appt @ [phone] ext [phone]

## 2012-04-06 NOTE — Patient Instructions (Addendum)
You will be contacted by [facility] with an appointment this week.  We have sent the following medications to your pharmacy for you to pick up at your convenience: Amoxicillin; take twice a day; clarithromycin: take twice a day  Start taking omeprazole after you have finished your antibiotics  Follow up with Dr. Rhea Belton in 3-4 weeks

## 2012-04-06 NOTE — ED Notes (Signed)
Patient with chest pain that started last night, with shortness of breath and nausea.  Patient describes pain in left chest, radiates to left arm and it is a pressure.

## 2012-04-06 NOTE — Telephone Encounter (Signed)
Notified GI that they will have to do a doc to doc call with the on-call physician if pt needs to be seen urgently.

## 2012-04-06 NOTE — Progress Notes (Signed)
Patient ID: Caleb Pace, male   DOB: 1970-11-22, 42 y.o.   MRN: [id number]  SUBJECTIVE: HPI Mr. Caleb Pace is a 42 yo Hispanic male with a past medical history of GERD and Helicobacter pylori who seen in follow-up.  He was seen last month for evaluation of epigastric abdominal pain, nausea and heartburn. He had an upper endoscopy thereafter which revealed gastroduodenitis biopsies confirming H. pylori infection. GE junction biopsy showed chronic reflux without evidence for Barrett's esophagus.  He was started on Pylera but has tolerated it poorly. He reports worsening of his epigastric abdominal pain and also reflux. He does report some headaches with the therapy. He's found it very difficult to take and he is insistent that Dexilant therapy worsens his reflux. He endorses water brash. Separate from this but also during the antibiotic. He reports pressure in his chest, occasionally in his left jaw, palpitations and lightheadedness. He was advised to seek care and evaluation in the emergency department today but he is convinced this is secondary to the H. pylori antibiotics.  He denies dyspnea.  A [employer] employee, Ms. Homero Fellers is a [place] interpreter for this encounter. Medical interpreter did not present for this appointment  Review of Systems  As per history of present illness, otherwise negative   Past Medical History  Diagnosis Date  . GERD (gastroesophageal reflux disease)   . Helicobacter pylori gastritis     Current Outpatient Prescriptions  Medication Sig Dispense Refill  . bismuth-metronidazole-tetracycline (PYLERA) 140-125-125 MG per capsule Take 3 capsules by mouth 4 (four) times daily -  before meals and at bedtime.  120 capsule  0  . fluocinonide-emollient (LIDEX-E) 0.05 % cream Apply topically 2 (two) times daily.  30 g  0  . amoxicillin-clavulanate (AUGMENTIN XR) 1000-62.5 MG per tablet Take 2 tablets by mouth 2 (two) times daily.  20 tablet  0  . clarithromycin (BIAXIN) 250 MG  tablet Take 1 tablet (250 mg total) by mouth 2 (two) times daily.  20 tablet  0  . omeprazole (PRILOSEC) 40 MG capsule Take 1 capsule (40 mg total) by mouth daily.  90 capsule  3   No current facility-administered medications for this visit.    Allergies  Allergen Reactions  . Asa (Aspirin)     History reviewed. No pertinent family history.  History  Substance Use Topics  . Smoking status: Current Every Day Smoker -- 0.25 packs/day for 10 years    Types: Cigarettes  . Smokeless tobacco: Never Used  . Alcohol Use: 0.0 oz/week    5-6 Cans of beer per week    OBJECTIVE: BP 110/78  Pulse 76  Ht 5\' 6"  (1.676 m)  Wt 172 lb (78.019 kg)  BMI 27.77 kg/m2  SpO2 98% Constitutional: Well-developed and well-nourished. No distress. HEENT: Normocephalic and atraumatic.No scleral icterus. Cardiovascular: Normal rate, regular rhythm and intact distal pulses. No M/R/G Pulmonary/chest: Effort normal and breath sounds normal. No wheezing, rales or rhonchi. Abdominal: Soft, nontender, nondistended. Bowel sounds active throughout. There are no masses palpable. No hepatosplenomegaly. Extremities: no clubbing, cyanosis, or edema Psychiatric: Normal mood and affect. Behavior is normal.  ASSESSMENT AND PLAN: 42 yo Hispanic male with a past medical history of GERD and Helicobacter pylori who seen in follow-up.  1.  GERD/H pylori gastroduodenitis -- he has H. pylori gastroduodenitis and likely GERD separate from this first issue. He has not been able to successfully complete antibiotics, and we have discussed the importance of completing antibiotic regimen and confirm eradication. We will switch him  to a different PPI as he is not tolerating Dexilant. I will start him on omeprazole 40 mg twice daily and he was given some samples today. I will switch him to amoxicillin 1000 mg twice daily and clarithromycin 500 mg twice daily for 10 days. He was asked to let me know if he cannot complete the antibiotics  completely.  He may require PPI on a more regular basis after completion antibody therapy if GERD symptoms persist  2.  Dizziness/palpitations -- I would like for him to be evaluated by cardiology as soon as possible as I'm not convinced that the symptoms relate to GERD, H. pylori, or antibiotics for H. Pylori.  He is aware of my recommendation to be evaluated in the ER, but would rather be evaluated by cardiology. We have contacted cardiology for an outpatient appointment, hopefully this week.  He agrees to go for this evaluation.

## 2012-04-07 NOTE — Progress Notes (Signed)
Patient discharged from the ED this AM with cc: tachy-palpitations and chest discomfort. He has a history of H pylori gastroduodenitis followed by Dr. Rhea Belton. May have limited fluency in [place]. No prior cardiac history. Trop-I WNL. CXR with mild bronchitic changes. EKG NSR without ST/T changes. Curbsided by fellow last night, and note left this morning to schedule follow-up appointment with recommendations for event monitor. This has been scheduled on 04/23/12 with Dr. Clifton James as a new patient visit. Further recommendations to be made at that time.    Jacqulyn Bath, PA-C 04/07/2012 11:28 AM

## 2012-04-09 NOTE — ED Provider Notes (Signed)
History     CSN: [id number]  Arrival date & time 04/06/12  2033   First MD Initiated Contact with Patient 04/06/12 2200      Chief Complaint  Patient presents with  . Chest Pain    Patient is a 42 y.o. male presenting with chest pain. The history is provided by the patient and a relative. The history is limited by a language barrier.  Chest Pain  Patient presents with palpitations associated with chest pressure. Symptoms have bee present for 3 days.  Palpitations described as fast heart beat.  No diaphoresis.  Patient evaluated many times in past for same complaint.  Hx of GERD.  Also complains of insomnia and inability to sleep.     Past Medical History  Diagnosis Date  . GERD (gastroesophageal reflux disease)   . Helicobacter pylori gastritis     History reviewed. No pertinent past surgical history.  No family history on file.  History  Substance Use Topics  . Smoking status: Current Every Day Smoker -- 0.25 packs/day for 10 years    Types: Cigarettes  . Smokeless tobacco: Never Used  . Alcohol Use: 0.0 oz/week    5-6 Cans of beer per week      Review of Systems  Unable to perform ROS: Other  Cardiovascular: Positive for chest pain.    Allergies  Asa and Lactose intolerance (gi)  Home Medications   Current Outpatient Rx  Name  Route  Sig  Dispense  Refill  . bismuth-metronidazole-tetracycline (PLYERA) 140-125-125 MG per capsule   Oral   Take 3 capsules by mouth 4 (four) times daily -  before meals and at bedtime.         Marland Kitchen omeprazole (PRILOSEC) 40 MG capsule   Oral   Take 1 capsule (40 mg total) by mouth daily.   90 capsule   3   . zolpidem (AMBIEN) 5 MG tablet   Oral   Take 1 tablet (5 mg total) by mouth at bedtime as needed for sleep.   30 tablet   0     BP 122/73  Pulse 63  Temp(Src) 98.6 F (37 C) (Oral)  Resp 16  SpO2 96%  Physical Exam  Nursing note and vitals reviewed. Constitutional: He is oriented to person, place, and time.  He appears well-developed and well-nourished. No distress.  HENT:  Head: Normocephalic and atraumatic.  Eyes: Pupils are equal, round, and reactive to light.  Neck: Normal range of motion.  Cardiovascular: Normal rate and intact distal pulses.   Pulmonary/Chest: No respiratory distress.  Abdominal: Normal appearance. He exhibits no distension.  Musculoskeletal: Normal range of motion.  Neurological: He is alert and oriented to person, place, and time. No cranial nerve deficit.  Skin: Skin is warm and dry. No rash noted.  Psychiatric: He has a normal mood and affect. His behavior is normal.    ED Course  Procedures (including critical care time)  Date: 04/09/2012  Rate: 61  Rhythm: normal sinus rhythm  QRS Axis: normal  Intervals: normal  ST/T Wave abnormalities: normal  Conduction Disutrbances: none  Narrative Interpretation: unremarkable     Labs Reviewed  BASIC METABOLIC PANEL - Abnormal; Notable for the following:    Glucose, Bld 125 (*)    All other components within normal limits  CBC  POCT I-STAT TROPONIN I   No results found.   1. Heart palpitations       MDM  Patient directed to f/u with cardiologist.  Unlikely ACS,  but event monitor might determine etiology of complaint.        Nelia Shi, MD 04/09/12 [phone]

## 2012-04-23 ENCOUNTER — Encounter: Payer: Self-pay | Admitting: Cardiovascular Disease

## 2012-04-23 NOTE — Progress Notes (Signed)
No show for appt. 

## 2012-04-27 ENCOUNTER — Encounter: Payer: Self-pay | Admitting: Internal Medicine

## 2012-04-28 ENCOUNTER — Ambulatory Visit: Payer: Self-pay | Admitting: Internal Medicine

## 2012-05-10 ENCOUNTER — Encounter: Payer: Self-pay | Admitting: Cardiovascular Disease

## 2012-05-17 ENCOUNTER — Telehealth: Payer: Self-pay | Admitting: *Deleted

## 2012-05-17 ENCOUNTER — Encounter: Payer: Self-pay | Admitting: *Deleted

## 2012-05-17 DIAGNOSIS — R17 Unspecified jaundice: Secondary | ICD-10-CM

## 2012-05-17 NOTE — Telephone Encounter (Signed)
Message copied by Florene Glen on Mon May 17, 2012  5:02 PM ------      Message from: Florene Glen      Created: Wed Mar 24, 2012  4:44 PM       Call niece, Lorie Phenix for pt to repeat hepatic panel d/t elevated bilirubin  [phone] ------

## 2012-05-17 NOTE — Telephone Encounter (Signed)
Spoke with niece to have pt call me. She reports he states his stomach is better.

## 2012-06-01 NOTE — Telephone Encounter (Signed)
I spoke with the patient and he is aware to come for lab work at his convenience

## 2012-09-29 ENCOUNTER — Ambulatory Visit: Payer: Self-pay | Admitting: Family Medicine

## 2012-09-29 VITALS — BP 142/84 | HR 72 | Temp 99.2°F | Resp 18 | Ht 66.5 in | Wt 171.0 lb

## 2012-09-29 DIAGNOSIS — L409 Psoriasis, unspecified: Secondary | ICD-10-CM

## 2012-09-29 DIAGNOSIS — R1013 Epigastric pain: Secondary | ICD-10-CM

## 2012-09-29 DIAGNOSIS — K219 Gastro-esophageal reflux disease without esophagitis: Secondary | ICD-10-CM

## 2012-09-29 LAB — HEPATIC FUNCTION PANEL
Albumin: 4.6 g/dL (ref 3.5–5.2)
Alkaline Phosphatase: 64 U/L (ref 39–117)
Indirect Bilirubin: 0.8 mg/dL (ref 0.0–0.9)
Total Bilirubin: 1 mg/dL (ref 0.3–1.2)

## 2012-09-29 LAB — POCT GLYCOSYLATED HEMOGLOBIN (HGB A1C): Hemoglobin A1C: 5

## 2012-09-29 MED ORDER — RANITIDINE HCL 150 MG PO TABS: 150.0000 mg | ORAL_TABLET | Freq: Two times a day (BID) | ORAL | Status: DC

## 2012-09-29 MED ORDER — TRIAMCINOLONE ACETONIDE 0.1 % EX CREA: TOPICAL_CREAM | Freq: Two times a day (BID) | CUTANEOUS | Status: DC

## 2012-09-29 NOTE — Progress Notes (Signed)
Subjective:    Patient ID: Caleb Pace, male    DOB: 18-Apr-1970, 42 y.o.   MRN: [id number] Chief Complaint  Patient presents with  . Abdominal Pain    x 2-4 days with Nausea, pt denies any V/D  . Headache    x1 week    HPI  Has had extensive stomach problems in the past with H. Pylori s/p complete evaluation by Dr. Dorena Bodo. Per review of Dr. Charyl Dancer notes, pt felt like Caleb Pace actually worsened on dexilant, had side effect to carafate, but pt reports for the past several mos, her gerd/gastritis sxs have actually been improving significantly on omeprazole.  Caleb Pace was treated twice for H. Pylori as the first time Caleb Pace could not completely tolerate the antibiotics - I assume Caleb Pace completed the second treatment.  Caleb Pace reports that Caleb Pace was feeling much better until the past week when his sxs - the same ones that Caleb Pace had prior to the H. Pylori treatment - have returned. Had nml abd [place] in 02/2012 and upper endoscopy showing gastritis in 02/2012. States Caleb Pace is having a lot of epigastric discomfort, decreased appetite, feels like food is getting stuck in chest, early satiety - has to eat slowly and in small amounts.  Normal stools.  Requests test for DM.  Requests cream for chronic psoriasis.  Past Medical History  Diagnosis Date  . GERD (gastroesophageal reflux disease)   . Helicobacter pylori gastritis    Allergies  Allergen Reactions  . Asa [Aspirin]     Upset stomach   . Lactose Intolerance (Gi)     Upset stomach      Current Outpatient Prescriptions on File Prior to Visit  Medication Sig Dispense Refill  . omeprazole (PRILOSEC) 40 MG capsule Take 1 capsule (40 mg total) by mouth daily.  90 capsule  3  . bismuth-metronidazole-tetracycline (PLYERA) 140-125-125 MG per capsule Take 3 capsules by mouth 4 (four) times daily -  before meals and at bedtime.      Marland Kitchen zolpidem (AMBIEN) 5 MG tablet Take 1 tablet (5 mg total) by mouth at bedtime as needed for sleep.  30 tablet  0   No current  facility-administered medications on file prior to visit.   History reviewed. No pertinent past surgical history. History   Social History  . Marital Status: Married    Spouse Name: N/A    Number of Children: 0  . Years of Education: N/A   Occupational History  . PAINTER    Social History Main Topics  . Smoking status: Current Every Day Smoker -- 0.25 packs/day for 10 years    Types: Cigarettes  . Smokeless tobacco: Never Used  . Alcohol Use: 0.0 oz/week    5-6 Cans of beer per week  . Drug Use: No  . Sexual Activity: None   Other Topics Concern  . None   Social History Narrative  . None   History reviewed. No pertinent family history.   Review of Systems  Constitutional: Positive for activity change and appetite change. Negative for fever, chills and diaphoresis.  Respiratory: Negative for shortness of breath.   Cardiovascular: Negative for chest pain.  Gastrointestinal: Positive for nausea and abdominal pain. Negative for vomiting, diarrhea, constipation, blood in stool, abdominal distention, anal bleeding and rectal pain.  Genitourinary: Negative for dysuria, frequency and decreased urine volume.  Skin: Positive for rash.  Neurological: Positive for headaches.  Hematological: Negative for adenopathy.      BP 142/84  Pulse 72  Temp(Src) 99.2  F (37.3 C) (Oral)  Resp 18  Ht 5' 6.5" (1.689 m)  Wt 171 lb (77.565 kg)  BMI 27.19 kg/m2  SpO2 98% Objective:   Physical Exam  Constitutional: Caleb Pace appears well-developed and well-nourished. No distress.  HENT:  Head: Normocephalic and atraumatic.  Neck: Normal range of motion. Neck supple. No thyromegaly present.  Cardiovascular: Normal rate, regular rhythm and normal heart sounds.   Pulmonary/Chest: Effort normal and breath sounds normal.  Abdominal: Soft. Normal appearance and bowel sounds are normal. Caleb Pace exhibits no distension and no mass. There is no hepatosplenomegaly. There is tenderness in the epigastric area,  periumbilical area and suprapubic area. There is no rebound, no guarding, no CVA tenderness, no tenderness at McBurney's point and negative Murphy's sign. No hernia.  Genitourinary: Rectum normal and prostate normal. Rectal exam shows no tenderness and anal tone normal. Guaiac negative stool.  Lymphadenopathy:    Caleb Pace has no cervical adenopathy.  Skin: Rash noted. Caleb Pace is not diaphoretic.  Well defined erythematous round plaques with white and silver scale on right forearm and LUQ abd.      Results for orders placed in visit on 09/29/12  POCT GLYCOSYLATED HEMOGLOBIN (HGB A1C)      Result Value Range   Hemoglobin A1C 5.0      Assessment & Plan:  GERD (gastroesophageal reflux disease)  Dyspepsia - Plan: HELICOBACTER PYLORI  ANTIBODY, IGM, POCT glycosylated hemoglobin (Hb A1C), Hepatic Function Panel - try adding in zantac in addition to the omeprazole since Caleb Pace has failed dexilant and carafate. If no improvement with this could consider temporarily increasing omeprazole to bid, if sxs worsen, consider checking a barium swallow or having pt f/u with Dr. Rhea Belton.  Psoriasis  Meds ordered this encounter  Medications  . triamcinolone cream (KENALOG) 0.1 %    Sig: Apply topically 2 (two) times daily.    Dispense:  454 g    Refill:  5  . ranitidine (ZANTAC) 150 MG tablet    Sig: Take 1 tablet (150 mg total) by mouth 2 (two) times daily.    Dispense:  180 tablet    Refill:  4

## 2012-09-29 NOTE — Patient Instructions (Addendum)
You do not have diabetes or even pre-diabetes. Your diabetes test (an average of all of your blood sugars over 3 months) is COMPLETELY normal!  Start taking the ranitidine twice a day in addition to the daily omeprazole.

## 2012-10-01 ENCOUNTER — Encounter: Payer: Self-pay | Admitting: Family Medicine

## 2012-10-06 ENCOUNTER — Telehealth: Payer: Self-pay

## 2012-10-06 NOTE — Telephone Encounter (Signed)
Pt called about labs. Gave him the message from the letter we sent. Pt isn't doing too much better on the Zantac, so advised him to make an appt with Dr. Rhea Belton.

## 2012-12-12 ENCOUNTER — Emergency Department (HOSPITAL_COMMUNITY)
Admission: EM | Admit: 2012-12-12 | Discharge: 2012-12-13 | Disposition: A | Discharge status: Home / Self Care | Payer: Self-pay | Attending: Emergency Medicine | Admitting: Emergency Medicine

## 2012-12-12 ENCOUNTER — Encounter (HOSPITAL_COMMUNITY): Payer: Self-pay | Admitting: Emergency Medicine

## 2012-12-12 DIAGNOSIS — R1310 Dysphagia, unspecified: Secondary | ICD-10-CM | POA: Insufficient documentation

## 2012-12-12 DIAGNOSIS — R05 Cough: Secondary | ICD-10-CM | POA: Insufficient documentation

## 2012-12-12 DIAGNOSIS — R030 Elevated blood-pressure reading, without diagnosis of hypertension: Secondary | ICD-10-CM

## 2012-12-12 DIAGNOSIS — K219 Gastro-esophageal reflux disease without esophagitis: Secondary | ICD-10-CM | POA: Insufficient documentation

## 2012-12-12 DIAGNOSIS — I10 Essential (primary) hypertension: Secondary | ICD-10-CM | POA: Insufficient documentation

## 2012-12-12 DIAGNOSIS — F17200 Nicotine dependence, unspecified, uncomplicated: Secondary | ICD-10-CM | POA: Insufficient documentation

## 2012-12-12 DIAGNOSIS — R059 Cough, unspecified: Secondary | ICD-10-CM | POA: Insufficient documentation

## 2012-12-12 DIAGNOSIS — Z79899 Other long term (current) drug therapy: Secondary | ICD-10-CM | POA: Insufficient documentation

## 2012-12-12 NOTE — ED Notes (Signed)
The pt has  Had high bp for one week with difficulty swallowing also for one week.  Takes no bp med

## 2012-12-13 ENCOUNTER — Telehealth: Payer: Self-pay | Admitting: *Deleted

## 2012-12-13 DIAGNOSIS — R1310 Dysphagia, unspecified: Secondary | ICD-10-CM

## 2012-12-13 MED ORDER — GI COCKTAIL ~~LOC~~
30.0000 mL | Freq: Once | ORAL | Status: AC
  Administered 2012-12-13: 30 mL via ORAL
  Filled 2012-12-13: qty 30

## 2012-12-13 NOTE — ED Provider Notes (Signed)
CSN: [id number]     Arrival date & time 12/12/12  2346 History   First MD Initiated Contact with Patient 12/13/12 0020     Chief Complaint  Patient presents with  . Hypertension   (Consider location/radiation/quality/duration/timing/severity/associated sxs/prior Treatment) HPI 42 year old male presents to emergency room with complaint of difficulties swallowing solids or liquids for the past week.  Patient reports when he tries to eat or drink, he feels that he is choking and that things are getting foamy and that he can't breathe.  He denies any vomiting.  Everything that he has tried to eat or drink has eventually gone down until today.  He reports he has had one day of coughing and gagging on anything he tries to eat or drink.  No prior history of same.  He does have history of GERD, is followed by a local gastroenterologist.  He had EGD done earlier this year, which showed H. pylori./GERD.  Patient also is complaining of elevated blood pressures for one day.  He reports that since he has been unable to eat or drink today, he has had elevated blood pressures to the 160s and 170s systolic.  He feels a tightness in his jaw muscles, which he attributes to having to force himself to try to swallow. Past Medical History  Diagnosis Date  . GERD (gastroesophageal reflux disease)   . Helicobacter pylori gastritis    History reviewed. No pertinent past surgical history. No family history on file. History  Substance Use Topics  . Smoking status: Current Every Day Smoker -- 0.25 packs/day for 10 years    Types: Cigarettes  . Smokeless tobacco: Never Used  . Alcohol Use: 0.0 oz/week    5-6 Cans of beer per week    Review of Systems  All other systems reviewed and are negative.    Allergies  Asa and Lactose intolerance (gi)  Home Medications   Current Outpatient Rx  Name  Route  Sig  Dispense  Refill  . omeprazole (PRILOSEC) 40 MG capsule   Oral   Take 1 capsule (40 mg total) by mouth  daily.   90 capsule   3   . Tetrahydrozoline HCl (VISINE OP)   Both Eyes   Place 2 drops into both eyes daily as needed (dry, red eyes).         . triamcinolone cream (KENALOG) 0.1 %   Topical   Apply topically 2 (two) times daily.   454 g   5   . zolpidem (AMBIEN) 5 MG tablet   Oral   Take 1 tablet (5 mg total) by mouth at bedtime as needed for sleep.   30 tablet   0    BP 146/99  Pulse 69  Temp(Src) 98.3 F (36.8 C) (Oral)  Resp 18  Ht 5\' 6"  (1.676 m)  Wt 170 lb (77.111 kg)  BMI 27.45 kg/m2  SpO2 96% Physical Exam  Nursing note and vitals reviewed. Constitutional: He is oriented to person, place, and time. He appears well-developed and well-nourished.  HENT:  Head: Normocephalic and atraumatic.  Right Ear: External ear normal.  Left Ear: External ear normal.  Nose: Nose normal.  Mouth/Throat: Oropharynx is clear and moist.  No posterior pharynx findings.  Mild anterior chain lymphadenopathy, without tenderness  Eyes: Conjunctivae and EOM are normal. Pupils are equal, round, and reactive to light.  Neck: Normal range of motion. Neck supple. No JVD present. No tracheal deviation present. No thyromegaly present.  Cardiovascular: Normal rate, regular rhythm,  normal heart sounds and intact distal pulses.  Exam reveals no gallop and no friction rub.   No murmur heard. Pulmonary/Chest: Effort normal and breath sounds normal. No stridor. No respiratory distress. He has no wheezes. He has no rales. He exhibits no tenderness.  Abdominal: Soft. Bowel sounds are normal. He exhibits no distension and no mass. There is no tenderness. There is no rebound and no guarding.  Musculoskeletal: Normal range of motion. He exhibits no edema and no tenderness.  Lymphadenopathy:    He has no cervical adenopathy.  Neurological: He is alert and oriented to person, place, and time. He has normal reflexes. No cranial nerve deficit. He exhibits normal muscle tone. Coordination normal.  Skin:  Skin is warm and dry. No rash noted. No erythema. No pallor.  Psychiatric: He has a normal mood and affect. His behavior is normal. Judgment and thought content normal.    ED Course  Procedures (including critical care time) Labs Review Labs Reviewed - No data to display Imaging Review No results found.  EKG Interpretation   None       MDM   1. Dysphagia   2. Borderline high blood pressure    42 year old male with worsening dysphasia.  Patient able to drink a GI cocktail without gagging, or difficulties with it passing.  Blood pressure slightly improved with no intervention.  Patient instructed to followup with his gastroenterologist, stick to a liquid diet until cleared by gastroenterology.  He is also to followup with his primary care Dr. for recheck of his blood pressures.    Olivia Mackie, MD 12/13/12 [phone]

## 2012-12-13 NOTE — Telephone Encounter (Signed)
Pt seen in ER for dysphagia of liquids and solids. Called him today and he can swallow liquids today. He will see Willette Cluster, NP tomorrow.

## 2012-12-13 NOTE — Telephone Encounter (Signed)
Message copied by Florene Glen on Mon Dec 13, 2012  1:50 PM ------      Message from: Beverley Fiedler      Created: Mon Dec 13, 2012  9:45 AM      Regarding: FW: Close follow up needed       This guy went to ED.  Needs to be seen.      Thanks      JMP            ----- Message -----         From: Olivia Mackie, MD         Sent: 12/13/2012   1:56 AM           To: Beverley Fiedler, MD      Subject: Close follow up needed                                   Dr Rhea Belton,            I saw Caleb Pace in the ER overnight.  He reports he is having increasing problems with swallowing over the last week, with inability to swallow today.  He reports the sensation of choking with everything coming back up.  He tolerated a GI cocktail here, and felt slightly better.  No signs of acute obstruction.  No sore throat/posterior pharynx complaints, more "hanging up" sensation at sternal notch. He had an endoscopy done earlier this year for GERD.  I will have him call the office later this morning.            Thanks!            Marian Sorrow       ------

## 2012-12-14 ENCOUNTER — Ambulatory Visit: Payer: Self-pay | Admitting: Nurse Practitioner

## 2012-12-14 ENCOUNTER — Telehealth: Payer: Self-pay | Admitting: *Deleted

## 2012-12-14 NOTE — Telephone Encounter (Signed)
Pt cancelled his appt with Gunnar Fusi today; note says he went to Urgent Care thinking they can help him.

## 2012-12-14 NOTE — Telephone Encounter (Signed)
The patient arrived for his visit with Caleb Pace.  His interpreter arrived as well. Our ER, [facility]  made the appointment for him to follow up with Caleb Pace today 12-14-2012.  The patient said he could not wait for today's appointment and he went  To [facility] , [address].  He was having trouble swallowing and felt he had something stuck and had trouble speaking.  They examined him, gave him an antibiotic and he is now much better,  Able to speak, and doesn't feel like there is anything stuck in his throat.  Per Caleb Cluster NP-C, he can call us if he feels he would like to see [place] at our practice.

## 2012-12-17 ENCOUNTER — Telehealth: Payer: Self-pay | Admitting: Internal Medicine

## 2012-12-17 ENCOUNTER — Ambulatory Visit (HOSPITAL_COMMUNITY)
Admission: RE | Admit: 2012-12-17 | Discharge: 2012-12-17 | Disposition: A | Discharge status: Home / Self Care | Payer: Self-pay | Source: Ambulatory Visit | Attending: Internal Medicine | Admitting: Internal Medicine

## 2012-12-17 DIAGNOSIS — K219 Gastro-esophageal reflux disease without esophagitis: Secondary | ICD-10-CM | POA: Insufficient documentation

## 2012-12-17 DIAGNOSIS — R1310 Dysphagia, unspecified: Secondary | ICD-10-CM

## 2012-12-17 DIAGNOSIS — Z79899 Other long term (current) drug therapy: Secondary | ICD-10-CM | POA: Insufficient documentation

## 2012-12-17 MED ORDER — ESOMEPRAZOLE MAGNESIUM 40 MG PO CPDR: DELAYED_RELEASE_CAPSULE | ORAL | Status: DC

## 2012-12-17 NOTE — Telephone Encounter (Signed)
Per Dr Rhea Belton, he reviewed the procedure and barium goes down easily; he has no blockages. Stop the Prilosec and begin the Nexium. Follow up with Dr Rhea Belton on 12/30/12. Advance his diet as tolerated: start with liquids, then soft foods, etc. Call for other problems and the samples will be at the front desk until 5pm today.

## 2012-12-17 NOTE — Telephone Encounter (Signed)
Spoke to Mr Rieves in spanish and gave him instructions. Verbalized understanding. Will copme pick up samples today. Will call and ask for me if he has any other problems instead of walking in to the office. Will keep appointment on 12-30-12 with Dr Rhea Belton.

## 2012-12-17 NOTE — Telephone Encounter (Signed)
Pt walked in the lobby c/o dysphagia; states he can't eat solid food; he can drink liquids, but had trouble with his secretions sometimes. When  Asked about his visit to UC he states they gave him a ZPACK thinking he has an infection. He denies a sore throat , but states it hurts when he swallows. Spoke with Dr Rhea Belton and he ordered a BS which we were able to schedule. Per Dr Rhea Belton, he may have a food impaction. Pt given instructions via one of staff who speaks Spanish and he will go now to [facility].

## 2012-12-22 ENCOUNTER — Encounter (HOSPITAL_COMMUNITY): Payer: Self-pay | Admitting: Gastroenterology

## 2012-12-22 ENCOUNTER — Telehealth: Payer: Self-pay | Admitting: *Deleted

## 2012-12-22 ENCOUNTER — Other Ambulatory Visit: Payer: Self-pay | Admitting: *Deleted

## 2012-12-22 ENCOUNTER — Encounter (HOSPITAL_COMMUNITY)
Admission: RE | Disposition: A | Discharge status: Home / Self Care | Payer: Self-pay | Source: Ambulatory Visit | Attending: Gastroenterology

## 2012-12-22 ENCOUNTER — Ambulatory Visit (HOSPITAL_COMMUNITY)
Admission: RE | Admit: 2012-12-22 | Discharge: 2012-12-22 | Disposition: A | Discharge status: Home / Self Care | Payer: Self-pay | Source: Ambulatory Visit | Attending: Gastroenterology | Admitting: Gastroenterology

## 2012-12-22 ENCOUNTER — Telehealth: Payer: Self-pay | Admitting: Internal Medicine

## 2012-12-22 DIAGNOSIS — Z79899 Other long term (current) drug therapy: Secondary | ICD-10-CM | POA: Insufficient documentation

## 2012-12-22 DIAGNOSIS — F17200 Nicotine dependence, unspecified, uncomplicated: Secondary | ICD-10-CM | POA: Insufficient documentation

## 2012-12-22 DIAGNOSIS — K219 Gastro-esophageal reflux disease without esophagitis: Secondary | ICD-10-CM | POA: Insufficient documentation

## 2012-12-22 DIAGNOSIS — K209 Esophagitis, unspecified without bleeding: Secondary | ICD-10-CM | POA: Insufficient documentation

## 2012-12-22 DIAGNOSIS — R1319 Other dysphagia: Secondary | ICD-10-CM

## 2012-12-22 DIAGNOSIS — R1310 Dysphagia, unspecified: Secondary | ICD-10-CM | POA: Insufficient documentation

## 2012-12-22 DIAGNOSIS — A048 Other specified bacterial intestinal infections: Secondary | ICD-10-CM

## 2012-12-22 HISTORY — PX: ESOPHAGOGASTRODUODENOSCOPY: SHX5428

## 2012-12-22 SURGERY — EGD (ESOPHAGOGASTRODUODENOSCOPY)
Anesthesia: Moderate Sedation

## 2012-12-22 MED ORDER — SODIUM CHLORIDE 0.9 % IV SOLN: INTRAVENOUS | Status: DC

## 2012-12-22 MED ORDER — FENTANYL CITRATE 0.05 MG/ML IJ SOLN
INTRAMUSCULAR | Status: AC
  Filled 2012-12-22: qty 2

## 2012-12-22 MED ORDER — BUTAMBEN-TETRACAINE-BENZOCAINE 2-2-14 % EX AERO
INHALATION_SPRAY | CUTANEOUS | Status: DC | PRN
  Administered 2012-12-22: 2 via TOPICAL

## 2012-12-22 MED ORDER — FENTANYL CITRATE 0.05 MG/ML IJ SOLN
INTRAMUSCULAR | Status: DC | PRN
  Administered 2012-12-22 (×4): 25 ug via INTRAVENOUS

## 2012-12-22 MED ORDER — MIDAZOLAM HCL 10 MG/2ML IJ SOLN
INTRAMUSCULAR | Status: AC
  Filled 2012-12-22: qty 2

## 2012-12-22 MED ORDER — MIDAZOLAM HCL 10 MG/2ML IJ SOLN
INTRAMUSCULAR | Status: DC | PRN
  Administered 2012-12-22 (×5): 2 mg via INTRAVENOUS

## 2012-12-22 NOTE — Telephone Encounter (Signed)
EGD will be done today by Dr Russella Dar, I have called patient to advice him in spanish to make sure he has a driver to the appoinment.

## 2012-12-22 NOTE — Telephone Encounter (Signed)
Caleb Carwin, MD Caleb Fiedler, MD Cc: Caleb Hoff, RN            Caleb Pace, This pt was seen at [facility] ED tonight with inability to swallow even sips of liquids. ED Dr Caleb Pace asked me to arrange for EGD for him in am. But I have reviewed his record and he had a Barium esophagram today. Could he have a Candida esophagitis? I told Then to give him Magic mouthwash tonight and we will call him in am with further instructions.      Pt called just now stating we are supposed to do an EGD today per [employer]. Pt reports he can't swallow pills. I asked through one on our girls about ordering Diflucan and he states he can't swallow it. He admitted to swallow water at 6 this am; he states he can't eat. Dr Caleb Pace will check with Dr Caleb Pace and pt was informed we will call him back.

## 2012-12-22 NOTE — Telephone Encounter (Signed)
Message copied by Florene Glen on Wed Dec 22, 2012 10:42 AM ------      Message from: Beverley Fiedler      Created: Wed Dec 22, 2012  8:41 AM      Regarding: FW: dysphagia       Aram Beecham, see telephone note from Dr. Juanda Chance      Patient had very recent barium swallow which did not show any evidence for obstruction or food impaction.      Empiric treatment with fluconazole for Candida esophagitis is reasonable.      Would treat with fluconazole 200 mg x1 day and 100 mg x13 days      Please arrange for upper endoscopy with plans for esophageal biopsy to exclude eosinophilic esophagitis      If this is negative he will need manometry      Please have him continue PPI as instructed last week when he walked in to the office            ----- Message -----         From: Hart Carwin, MD         Sent: 12/21/2012   9:16 PM           To: Linna Hoff, RN, Beverley Fiedler, MD      Subject: dysphagia                                                Vonna Kotyk, This pt was seen at [facility] ED tonight with inability to swallow even sips of liquids. ED Dr Ronald Lobo asked me to arrange for EGD for him in am. But I have reviewed his record and he had a Barium esophagram today. Could he have a Candida esophagitis? I told  Then to give him Magic mouthwash tonight and we will call him in am with further instructions.       ------

## 2012-12-22 NOTE — H&P (Addendum)
Chief Complaint   Patient presents with   .  Dysphagia  (Consider location/radiation/quality/duration/timing/severity/associated sxs/prior  Treatment)  HPI  42 year old male presents with complaint of difficulties swallowing solids greater than liquids for the past few weeks. Patient reports when he tries to eat or drink, he feels that he is choking. He localizes the symptoms to his neck. He denies vomiting. He reports he has had coughing and gagging with swallowing. He has history of GERD. He had EGD done earlier this year, which showed H. pylori/GERD.  He feels a tightness in his jaw and neck muscles, which he attributes to having to force himself to try to swallow. BA esophagram last week was unremarkable. He was at [facility] ED last night. Past Medical History   Diagnosis  Date   .  GERD (gastroesophageal reflux disease)    .  Helicobacter pylori gastritis    History reviewed. No pertinent past surgical history.  No family history on file.  History   Substance Use Topics   .  Smoking status:  Current Every Day Smoker -- 0.25 packs/day for 10 years     Types:  Cigarettes   .  Smokeless tobacco:  Never Used   .  Alcohol Use:  0.0 oz/week     5-6 Cans of beer per week   Review of Systems  All other systems reviewed and are negative.  Allergies   Asa and Lactose intolerance (gi)  Home Medications    Current Outpatient Rx   Name   Route   Sig   Dispense   Refill   .  omeprazole (PRILOSEC) 40 MG capsule   Oral   Take 1 capsule (40 mg total) by mouth daily.   90 capsule   3   .  Tetrahydrozoline HCl (VISINE OP)   Both Eyes   Place 2 drops into both eyes daily as needed (dry, red eyes).       .  triamcinolone cream (KENALOG) 0.1 %   Topical   Apply topically 2 (two) times daily.   454 g   5   .  zolpidem (AMBIEN) 5 MG tablet   Oral   Take 1 tablet (5 mg total) by mouth at bedtime as needed for sleep.   30 tablet   0   BP 146/99  Pulse 69  Temp(Src) 98.3 F (36.8 C) (Oral)  Resp 18  Ht  5\' 6"  (1.676 m)  Wt 170 lb (77.111 kg)  BMI 27.45 kg/m2  SpO2 96%  Physical Exam  Nursing note and vitals reviewed.  Constitutional: He is oriented to person, place, and time. He appears well-developed and well-nourished.  HENT:  Head: Normocephalic and atraumatic.  Right Ear: External ear normal.  Left Ear: External ear normal.  Nose: Nose normal.  Mouth/Throat: Oropharynx is clear and moist.  Eyes: Conjunctivae and EOM are normal. Pupils are equal, round, and reactive to light.  Neck: Normal range of motion. Neck supple. No JVD present. No tracheal deviation present. No thyromegaly present.  Cardiovascular: Normal rate, regular rhythm, normal heart sounds and intact distal pulses. Exam reveals no gallop and no friction rub.  No murmur heard.  Pulmonary/Chest: Effort normal and breath sounds normal. No stridor. No respiratory distress. He has no wheezes. He has no rales. He exhibits no tenderness.  Abdominal: Soft. Bowel sounds are normal. He exhibits no distension and no mass. There is no tenderness. There is no rebound and no guarding.  Musculoskeletal: Normal range of motion. He exhibits no  edema and no tenderness.  Lymphadenopathy:  He has no cervical adenopathy.  Neurological: He is alert and oriented to person, place, and time. He has normal reflexes. No cranial nerve deficit. He exhibits normal muscle tone. Coordination normal.  Skin: Skin is warm and dry. No rash noted. No erythema. No pallor.  Psychiatric: He has a normal mood and affect. His behavior is normal. Judgment and thought content normal.  ED Course   Procedures (including critical care time)  Labs Review  Labs Reviewed - No data to display  Imaging Review  No results found.  EKG Interpretation    None      MDM    1.  Dysphagia   78 GERD  42 year old male with worsening dysphasia, solids greater than liquids, etiology unclear. BA esophagram with tablet last week showed only mild reflux. R/O eosinophilic  esophagitis. R/O ENT or oropharyngeal disorder. Localizes symptoms to this throat/neck area. Relates muscle tightness in his jaw/neck. Dr. Rhea Belton has asked me to perform EGD today to further evaluate. Continue Nexium for GERD.

## 2012-12-22 NOTE — Telephone Encounter (Signed)
Scheduled pt for EGD by Dr Russella Dar at [facility]. Notified pt via [employer]; he was instructed to go to Admitting at [facility] by 12Noon. Pt states he drank a little water at 6am and nothing since. Pt stated understanding.

## 2012-12-22 NOTE — Op Note (Signed)
[facility] [address] [place], [address]   ENDOSCOPY PROCEDURE REPORT  PATIENT: Caleb Pace, Caleb Pace  MR#: [id number] BIRTHDATE: 06-22-1970 , 42  yrs. old GENDER: Male ENDOSCOPIST: Meryl Dare, MD, [facility] PROCEDURE DATE:  12/22/2012 PROCEDURE:  EGD w/ biopsy and Savary dilation of esophagus ASA CLASS:     Class II INDICATIONS:  Dysphagia.   History of esophageal reflux. MEDICATIONS: medications were titrated to patient response per physician's verbal order, Fentanyl 100 mcg IV, and Versed 10 mg IV  TOPICAL ANESTHETIC: Cetacaine Spray DESCRIPTION OF PROCEDURE: After the risks benefits and alternatives of the procedure were thoroughly explained, informed consent was obtained.  The Pentax Gastroscope [id number] endoscope was introduced through the mouth and advanced to the second portion of the duodenum  without limitations.  The instrument was slowly withdrawn as the mucosa was fully examined.  ESOPHAGUS: There was LA Class B esophagitis noted.   Multiple random biopsies were performed in the distal and mid esophagus. STOMACH: The mucosa and folds of the stomach appeared normal. DUODENUM: The duodenal mucosa showed no abnormalities in the bulb and second portion of the duodenum.  Retroflexed views revealed no abnormalities.  A guidewire was place and the scope was then withdrawn from the patient. A 17 mm Savary dilator was passed over the guidewire for dysphagia without a stricture. The dilator and guidewire were removed from the patient and the procedure completed.  COMPLICATIONS: There were no complications.  ENDOSCOPIC IMPRESSION: 1.   LA Class B esophagitis 2.   Multiple random esophageal biopsies  RECOMMENDATIONS: 1.  Await pathology results 2.  Anti-reflux regimen 3.  Increase PPI to bid for at least 8 weeks 4.  OP follow-up and further plans per Dr. Dorena Bodo   eSigned:  Meryl Dare, MD, [facility] 12/22/2012 2:16 PM

## 2012-12-24 ENCOUNTER — Encounter: Payer: Self-pay | Admitting: Gastroenterology

## 2012-12-27 ENCOUNTER — Other Ambulatory Visit: Payer: Self-pay | Admitting: *Deleted

## 2012-12-27 ENCOUNTER — Encounter (HOSPITAL_COMMUNITY): Payer: Self-pay | Admitting: Gastroenterology

## 2012-12-27 DIAGNOSIS — A048 Other specified bacterial intestinal infections: Secondary | ICD-10-CM

## 2012-12-27 MED ORDER — OMEPRAZOLE 40 MG PO CPDR: DELAYED_RELEASE_CAPSULE | ORAL | Status: DC

## 2012-12-29 ENCOUNTER — Encounter: Payer: Self-pay | Admitting: Internal Medicine

## 2012-12-30 ENCOUNTER — Encounter: Payer: Self-pay | Admitting: Internal Medicine

## 2012-12-30 ENCOUNTER — Telehealth: Payer: Self-pay | Admitting: Internal Medicine

## 2012-12-30 ENCOUNTER — Ambulatory Visit (INDEPENDENT_AMBULATORY_CARE_PROVIDER_SITE_OTHER): Payer: Self-pay | Admitting: Internal Medicine

## 2012-12-30 VITALS — BP 138/84 | HR 70 | Ht 67.0 in | Wt 161.0 lb

## 2012-12-30 DIAGNOSIS — J029 Acute pharyngitis, unspecified: Secondary | ICD-10-CM

## 2012-12-30 DIAGNOSIS — J3489 Other specified disorders of nose and nasal sinuses: Secondary | ICD-10-CM

## 2012-12-30 DIAGNOSIS — K219 Gastro-esophageal reflux disease without esophagitis: Secondary | ICD-10-CM

## 2012-12-30 MED ORDER — ESOMEPRAZOLE MAGNESIUM 40 MG PO CPDR: 40.0000 mg | DELAYED_RELEASE_CAPSULE | Freq: Every day | ORAL | Status: DC

## 2012-12-30 NOTE — Telephone Encounter (Signed)
Pt is suppose to be getting nexium. He may be getting the generic Esomeprazole

## 2012-12-30 NOTE — Progress Notes (Signed)
Subjective:    Patient ID: Caleb Pace, male    DOB: 11/03/1970, 42 y.o.   MRN: [id number]  HPI Caleb Pace is a 42 yo Hispanic male with a past medical history of GERD and Helicobacter pylori who seen in follow-up. Over the past several months he has had issues with dysphagia and odynophagia. He is presented to the office on several occasions without an appointment complaining of significant dysphagia. He had a barium swallow which was unremarkable and his PPI was restarted. This didn't seem to improve his swallowing dysfunction quickly and he came again to the office complaining of dysphagia. Urgent endoscopy was arranged on 12/22/2012 which was performed by Dr. Russella Dar. This showed class B. reflux esophagitis without stricture. Biopsies were performed which were negative for eosinophilic esophagitis. The stomach and small bowel were reportedly normal appearing. After endoscopy his Nexium was increased to 40 mg twice daily and he returns for followup. He reports this has helped. He is still having some dysphagia that has improved. His heartburn has also improved somewhat. He continues to complain of neck soreness bilaterally below his ears and also pain radiates up the back of his neck into his skull. This has been present off and on for several months. This doesn't seem to be triggered by swallowing. He reports some sinus pressure but no pain. No fever or chills. He does have a mild sore throat. No cough. No chest pain. No dyspnea. Bowel habits have been regular for him. No blood or melena  Review of Systems As per history of present illness, otherwise negative  Current Medications, Allergies, Past Medical History, Past Surgical History, Family History and Social History were reviewed in Caleb Pace record.     Objective:   Physical Exam BP 138/84  Pulse 70  Ht 5\' 7"  (1.702 m)  Wt 161 lb (73.029 kg)  BMI 25.21 kg/m2 Constitutional: Well-developed and  well-nourished. No distress. HEENT: Normocephalic and atraumatic. Oropharynx is clear and moist. No oropharyngeal exudate. Conjunctivae are normal.  No scleral icterus. Neck: Neck supple. Trachea midline. Cardiovascular: Normal rate, regular rhythm and intact distal pulses. No M/R/G Pulmonary/chest: Effort normal and breath sounds normal. No wheezing, rales or rhonchi. Abdominal: Soft, nontender, nondistended. Bowel sounds active throughout.  Extremities: no clubbing, cyanosis, or edema Neurological: Alert and oriented to person place and time. Psychiatric: Normal mood and affect. Behavior is normal.  ESOPHOGRAM / BARIUM SWALLOW / BARIUM TABLET STUDY -- Nov 2014   TECHNIQUE: Combined double contrast and single contrast examination performed using effervescent crystals, thick barium liquid, and thin barium liquid. The patient was observed with fluoroscopy swallowing a 13mm barium sulphate tablet.   COMPARISON:  None.   FLUOROSCOPY TIME:  54 seconds   FINDINGS: The esophageal motility is within normal limits. Rapid sequence imaging of the pharynx in the AP and lateral projections demonstrates no mucosal abnormalities. There is no evidence of esophageal mass, stricture or ulceration. There is no significant hiatal hernia. Mild gastroesophageal reflux was elicited with the water siphon test.   A 13 mm barium what tablet was administered. This passed without delay into the stomach.   IMPRESSION: 1. Mild gastroesophageal reflux. 2. No evidence of esophagitis, stricture or mass.  EGD - Dr. Russella Dar -- 12/22/2012 -- LA class B. reflux esophagitis, normal stomach, normal duodenum. Empiric Savory dilation to 17 mm.  Esophageal biopsies = negative for EoE     Assessment & Plan:  42 yo Hispanic male with a past medical history of GERD  and Helicobacter pylori who seen in follow-up.   1.  GERD/reflux esophagitis -- his dysphagia is felt secondary to reflux esophagitis. His barium swallow  showed no significant evidence of dysmotility or achalasia. I had considered esophageal manometry, but given his reflux esophagitis and normal barium swallow, soft to manometry is very low yield. We will not order it at this time. I would like him to take Nexium 40 mg twice daily for at least 8 weeks and then daily thereafter. He is also given a copy of a GERD diet and foods to avoid. I've also asked that he not eat or drink within 2 hours of lying down. He voices understanding.  2.  Sinus pressure/neck pain -- unclear etiology. I recommended over-the-counter Claritin or Zyrtec per box instructions. If this continues he can be referred to ENT or see his primary care. Certainly reflux could be contributing to his sore throat, but he could also have a component of postnasal drip or chronic sinus inflammation. The Nexium as discussed in number one should improve his acid reflux.

## 2012-12-30 NOTE — Patient Instructions (Addendum)
You have been given a separate informational sheet regarding your tobacco use, the importance of quitting and local resources to help you quit. We have sent the following medications to your pharmacy for you to pick up at your convenience: Nexium; as you have been taking for the next 8 weeks then start taking nexium daily.  Use Visine otc as needed  Use Zyrtec or Claritin Over the counter per box instructions.                                               We are excited to introduce MyChart, a new best-in-class service that provides you online access to important information in your electronic medical record. We want to make it easier for you to view your health information - all in one secure location - when and where you need it. We expect MyChart will enhance the quality of care and service we provide.  When you register for MyChart, you can:    View your test results.    Request appointments and receive appointment reminders via email.    Request medication renewals.    View your medical history, allergies, medications and immunizations.    Communicate with your physician's office through a password-protected site.    Conveniently print information such as your medication lists.  To find out if MyChart is right for you, please talk to a member of our clinical staff today. We will gladly answer your questions about this free health and wellness tool.  If you are age 41 or older and want a member of your family to have access to your record, you must provide written consent by completing a proxy form available at our office. Please speak to our clinical staff about guidelines regarding accounts for patients younger than age 41.  As you activate your MyChart account and need any technical assistance, please call the MyChart technical support line at [phone]) or email your question to [email or web address]. If you email your question(s), please include your name,  a return phone number and the best time to reach you.  If you have non-urgent health-related questions, you can send a message to our office through MyChart at [email or web address]. If you have a medical emergency, call 911.  Thank you for using MyChart as your new health and wellness resource!   MyChart licensed from [employer],  4540-9811. Patents Pending.

## 2012-12-31 ENCOUNTER — Telehealth: Payer: Self-pay | Admitting: Internal Medicine

## 2012-12-31 MED ORDER — ESOMEPRAZOLE MAGNESIUM 40 MG PO CPDR: 40.0000 mg | DELAYED_RELEASE_CAPSULE | Freq: Every day | ORAL | Status: DC

## 2012-12-31 NOTE — Telephone Encounter (Signed)
RESENT IN NEXIUM TO PT'S PHARMACY

## 2013-01-03 ENCOUNTER — Ambulatory Visit (HOSPITAL_COMMUNITY): Admission: RE | Admit: 2013-01-03 | Payer: Self-pay | Source: Ambulatory Visit | Admitting: Internal Medicine

## 2013-01-03 ENCOUNTER — Encounter (HOSPITAL_COMMUNITY): Admission: RE | Payer: Self-pay | Source: Ambulatory Visit

## 2013-01-03 SURGERY — MANOMETRY, ESOPHAGUS

## 2013-01-04 ENCOUNTER — Encounter (HOSPITAL_COMMUNITY): Admission: RE | Payer: Self-pay | Source: Ambulatory Visit

## 2013-01-04 ENCOUNTER — Ambulatory Visit (HOSPITAL_COMMUNITY): Admission: RE | Admit: 2013-01-04 | Payer: Self-pay | Source: Ambulatory Visit | Admitting: Internal Medicine

## 2013-01-04 SURGERY — MANOMETRY, ESOPHAGUS

## 2013-01-12 NOTE — Telephone Encounter (Signed)
Error

## 2013-05-05 ENCOUNTER — Ambulatory Visit (INDEPENDENT_AMBULATORY_CARE_PROVIDER_SITE_OTHER): Payer: Self-pay | Admitting: Physician Assistant

## 2013-05-05 VITALS — BP 124/70 | HR 74 | Temp 98.8°F | Resp 18 | Ht 65.5 in | Wt 166.0 lb

## 2013-05-05 DIAGNOSIS — R12 Heartburn: Secondary | ICD-10-CM

## 2013-05-05 DIAGNOSIS — Z131 Encounter for screening for diabetes mellitus: Secondary | ICD-10-CM

## 2013-05-05 LAB — GLUCOSE, POCT (MANUAL RESULT ENTRY): POC GLUCOSE: 111 mg/dL — AB (ref 70–99)

## 2013-05-05 MED ORDER — RANITIDINE HCL 300 MG PO TABS: 300.0000 mg | ORAL_TABLET | Freq: Every day | ORAL | Status: DC

## 2013-05-05 MED ORDER — LANSOPRAZOLE 30 MG PO CPDR: 30.0000 mg | DELAYED_RELEASE_CAPSULE | Freq: Two times a day (BID) | ORAL | Status: DC

## 2013-05-05 MED ORDER — TRIAMCINOLONE ACETONIDE 0.5 % EX OINT: 1.0000 "application " | TOPICAL_OINTMENT | Freq: Two times a day (BID) | CUTANEOUS | Status: DC

## 2013-05-05 NOTE — Progress Notes (Signed)
   Subjective:    Patient ID: Caleb Pace, male    DOB: 02-09-1970, 43 y.o.   MRN: [id number]  HPI  Pt presents to clinic with chronic reflux and over the last 3 days worsening symptoms.  He has been on Nexium before and he ran out so he got the OTC prilosec and he feels like it has helped some but over the last several days his symptoms have gotten worse.  He drinks several beers a day, smokes 1/2 ppd and drinks sodas all day and lays down about 1-1.5 hr after eating.  He has a bitter taste and burning sensation in his throat that is worse with laying down.  In 2010 he was diagnosed with H pylori.  He also has psoriasis and he has been on triamcinolone ointment that works better than the last rx of triamcinolone cream.  His psoriasis in on his R elbow and he seems to also keep it so he wears a sleeve to cover it up.  He also feels like he gets headaches when his reflux is bad.  He takes tylenol and the headaches resolve.  [place] and has never had problems with allergies and does not currently have a cold.  Pt would like to be screened for diabetes.    Review of Systems  Skin: Positive for rash.  Neurological: Positive for headaches.       Objective:   Physical Exam  Vitals reviewed. Constitutional: He is oriented to person, place, and time. He appears well-developed and well-nourished.  HENT:  Head: Normocephalic and atraumatic.  Right Ear: External ear normal.  Left Ear: External ear normal.  Nose: Mucosal edema (minimal erythema) present.  Eyes: Conjunctivae are normal.  Cardiovascular: Normal rate, regular rhythm and normal heart sounds.   No murmur heard. Pulmonary/Chest: Effort normal and breath sounds normal. He has no wheezes.  Abdominal: Soft. There is tenderness in the epigastric area.  Neurological: He is alert and oriented to person, place, and time.  Skin: Skin is warm and dry.  Psychiatric: He has a normal mood and affect. His behavior is  normal. Judgment and thought content normal.       Assessment & Plan:  Screening for diabetes mellitus - Plan: POCT glucose (manual entry)  Psoriasis - Triamcinolone ointment (KENALOG) 0.5 %  Heartburn - Plan: lansoprazole (PREVACID) 30 MG capsule, ranitidine (ZANTAC) 300 MG tablet - we discussed lifestyle changes for improved symptom control - if he is no better in 2 weeks we will retreat for H pylori -   Benny LennertSarah Kathey Simer PA-C  [facility] 05/05/2013 9:13 PM

## 2013-05-05 NOTE — Patient Instructions (Signed)
Use the cream for your psoriasis. For the next 2 weeks take bother the prevacid and the zantac and then if you are no better call for the infection treatment again.  If after a month your symptoms are much improved go back to the OTC prilosec or get the pharmacy to call for a refill.    Gastroesophageal Reflux Disease, Adult Gastroesophageal reflux disease (GERD) happens when acid from your stomach goes into your food pipe (esophagus). The acid can cause a burning feeling in your chest. Over time, the acid can make small holes (ulcers) in your food pipe.  HOME CARE  Ask your doctor for advice about:  Losing weight.  Quitting smoking.  Alcohol use.  Avoid foods and drinks that make your problems worse. You may want to avoid:  Caffeine and alcohol.  Chocolate.  Mints.  Garlic and onions.  Spicy foods.  Citrus fruits, such as oranges, lemons, or limes.  Foods that contain tomato, such as sauce, chili, salsa, and pizza.  Fried and fatty foods.  Avoid lying down for 3 hours before you go to bed or before you take a nap.  Eat small meals often, instead of large meals.  Wear loose-fitting clothing. Do not wear anything tight around your waist.  Raise (elevate) the head of your bed 6 to 8 inches with wood blocks. Using extra pillows does not help.  Only take medicines as told by your doctor.  Do not take aspirin or ibuprofen. GET HELP RIGHT AWAY IF:   You have pain in your arms, neck, jaw, teeth, or back.  Your pain gets worse or changes.  You feel sick to your stomach (nauseous), throw up (vomit), or sweat (diaphoresis).  You feel short of breath, or you pass out (faint).  Your throw up is green, yellow, black, or looks like coffee grounds or blood.  Your poop (stool) is red, bloody, or black. MAKE SURE YOU:   Understand these instructions.  Will watch your condition.  Will get help right away if you are not doing well or get worse. Document Released:  07/02/2007 Document Revised: 04/07/2011 Document Reviewed: 08/02/2010 [facility] Patient Information 2014 [employer] [place].

## 2016-01-02 ENCOUNTER — Emergency Department (HOSPITAL_COMMUNITY)
Admission: EM | Admit: 2016-01-02 | Discharge: 2016-01-02 | Disposition: A | Discharge status: Home / Self Care | Payer: Self-pay | Attending: Emergency Medicine | Admitting: Emergency Medicine

## 2016-01-02 ENCOUNTER — Emergency Department (HOSPITAL_COMMUNITY): Payer: Self-pay

## 2016-01-02 ENCOUNTER — Encounter (HOSPITAL_COMMUNITY): Payer: Self-pay | Admitting: Emergency Medicine

## 2016-01-02 DIAGNOSIS — R079 Chest pain, unspecified: Secondary | ICD-10-CM | POA: Insufficient documentation

## 2016-01-02 DIAGNOSIS — R197 Diarrhea, unspecified: Secondary | ICD-10-CM | POA: Insufficient documentation

## 2016-01-02 DIAGNOSIS — Z87891 Personal history of nicotine dependence: Secondary | ICD-10-CM | POA: Insufficient documentation

## 2016-01-02 DIAGNOSIS — R110 Nausea: Secondary | ICD-10-CM | POA: Insufficient documentation

## 2016-01-02 LAB — BASIC METABOLIC PANEL
Anion gap: 7 (ref 5–15)
BUN: 8 mg/dL (ref 6–20)
CALCIUM: 8.9 mg/dL (ref 8.9–10.3)
CHLORIDE: 106 mmol/L (ref 101–111)
CO2: 23 mmol/L (ref 22–32)
CREATININE: 0.55 mg/dL — AB (ref 0.61–1.24)
GFR calc Af Amer: >60 mL/min (ref >60)
GFR calc non Af Amer: >60 mL/min (ref >60)
Glucose, Bld: 103 mg/dL — ABNORMAL HIGH (ref 65–99)
Potassium: 3.7 mmol/L (ref 3.5–5.1)
Sodium: 136 mmol/L (ref 135–145)

## 2016-01-02 LAB — HEPATIC FUNCTION PANEL
ALT: 61 U/L (ref 17–63)
AST: 41 U/L (ref 15–41)
Albumin: 4.1 g/dL (ref 3.5–5.0)
Alkaline Phosphatase: 61 U/L (ref 38–126)
BILIRUBIN INDIRECT: 1.1 mg/dL — AB (ref 0.3–0.9)
Bilirubin, Direct: 0.2 mg/dL (ref 0.1–0.5)
Total Bilirubin: 1.3 mg/dL — ABNORMAL HIGH (ref 0.3–1.2)
Total Protein: 7 g/dL (ref 6.5–8.1)

## 2016-01-02 LAB — CBC WITH DIFFERENTIAL/PLATELET
BASOS PCT: 1 %
Basophils Absolute: 0 10*3/uL (ref 0.0–0.1)
EOS PCT: 7 %
Eosinophils Absolute: 0.4 10*3/uL (ref 0.0–0.7)
HCT: 48.1 % (ref 39.0–52.0)
Hemoglobin: 17 g/dL (ref 13.0–17.0)
LYMPHS ABS: 2.2 10*3/uL (ref 0.7–4.0)
Lymphocytes Relative: 41 %
MCH: 33.2 pg (ref 26.0–34.0)
MCHC: 35.3 g/dL (ref 30.0–36.0)
MCV: 93.9 fL (ref 78.0–100.0)
MONO ABS: 0.4 10*3/uL (ref 0.1–1.0)
MONOS PCT: 8 %
Neutro Abs: 2.4 10*3/uL (ref 1.7–7.7)
Neutrophils Relative %: 43 %
Platelets: 170 10*3/uL (ref 150–400)
RBC: 5.12 MIL/uL (ref 4.22–5.81)
RDW: 12.4 % (ref 11.5–15.5)
WBC: 5.5 10*3/uL (ref 4.0–10.5)

## 2016-01-02 LAB — LIPASE, BLOOD: Lipase: 26 U/L (ref 11–51)

## 2016-01-02 LAB — I-STAT TROPONIN, ED: TROPONIN I, POC: 0 ng/mL (ref 0.00–0.08)

## 2016-01-02 MED ORDER — SODIUM CHLORIDE 0.9 % IV BOLUS (SEPSIS)
500.0000 mL | Freq: Once | INTRAVENOUS | Status: AC
  Administered 2016-01-02: 500 mL via INTRAVENOUS

## 2016-01-02 MED ORDER — ACIDOPHILUS PROBIOTIC 10 MG PO TABS: 10.0000 mg | ORAL_TABLET | Freq: Three times a day (TID) | ORAL | 0 refills | Status: DC

## 2016-01-02 MED ORDER — ONDANSETRON 4 MG PO TBDP
4.0000 mg | ORAL_TABLET | Freq: Once | ORAL | Status: AC
  Administered 2016-01-02: 4 mg via ORAL
  Filled 2016-01-02: qty 1

## 2016-01-02 MED ORDER — ONDANSETRON 4 MG PO TBDP: 4.0000 mg | ORAL_TABLET | Freq: Three times a day (TID) | ORAL | 0 refills | Status: DC | PRN

## 2016-01-02 NOTE — ED Notes (Signed)
Patient given graham crackers and water for PO trial.  Patient states he feels "a little better".

## 2016-01-02 NOTE — ED Provider Notes (Signed)
[facility]-EMERGENCY DEPT Provider Note   CSN: [id number] Arrival date & time: 01/02/16  0603     History   Chief Complaint Chief Complaint  Patient presents with  . Chest Pain    HPI Caleb Pace is a 45 y.o. male.  Caleb Pace is a 45 y.o. Male who presents to the ED complaining of waking up with left sided chest pain since 0230 this am. Patient reports he woke up with left sided non-radiating chest pain and then began having nausea and diarrhea. He reports three loose stools today. He reports feeling short of breath with this chest pain. He reports taking a baby aspirin prior to arrival and that currently his chest pain is resolving. He complains of some nausea but has had no vomiting. He denies any abdominal pain. He also reports a headache. He is a former smoker. He denies history of hypertension or hyperlipidemia.  He denies personal or close family history of MI, DVT or PE. He denies fevers, abdominal pain, hematemesis, hematochezia, cough, hemoptysis, leg pain, leg swelling, recent long travel, numbness, tingling, weakness or syncope.   The history is provided by the patient. No language interpreter was used.  Chest Pain   Associated symptoms include headaches, nausea and shortness of breath. Pertinent negatives include no abdominal pain, no back pain, no cough, no fever, no numbness, no palpitations, no vomiting and no weakness.    Past Medical History:  Diagnosis Date  . GERD (gastroesophageal reflux disease)   . Helicobacter pylori gastritis     Patient Active Problem List   Diagnosis Date Noted  . Other dysphagia 12/22/2012  . GERD (gastroesophageal reflux disease) 04/06/2012  . History of Helicobacter pylori infection 03/16/2012  . Dyspepsia 03/16/2012    Past Surgical History:  Procedure Laterality Date  . ESOPHAGOGASTRODUODENOSCOPY N/A 12/22/2012   Procedure: ESOPHAGOGASTRODUODENOSCOPY (EGD);  Surgeon: Meryl Dare, MD;  Location: [facility];   Service: Endoscopy;  Laterality: N/A;       Home Medications    Prior to Admission medications   Medication Sig Start Date End Date Taking? Authorizing Provider  Lactobacillus (ACIDOPHILUS PROBIOTIC) 10 MG TABS Take 10 mg by mouth 3 (three) times daily. 01/02/16   Everlene Farrier, PA-C  lansoprazole (PREVACID) 30 MG capsule Take 1 capsule (30 mg total) by mouth 2 (two) times daily before a meal. Patient not taking: Reported on 01/02/2016 05/05/13   Morrell Riddle, PA-C  ondansetron (ZOFRAN ODT) 4 MG disintegrating tablet Take 1 tablet (4 mg total) by mouth every 8 (eight) hours as needed for nausea or vomiting. 01/02/16   Everlene Farrier, PA-C  ranitidine (ZANTAC) 300 MG tablet Take 1 tablet (300 mg total) by mouth at bedtime. Patient not taking: Reported on 01/02/2016 05/05/13   Morrell Riddle, PA-C  triamcinolone ointment (KENALOG) 0.5 % Apply 1 application topically 2 (two) times daily. Patient not taking: Reported on 01/02/2016 05/05/13   Morrell Riddle, PA-C    Family History No family history on file.  Social History Social History  Substance Use Topics  . Smoking status: Former Smoker    Packs/day: 0.25    Years: 10.00    Types: Cigarettes  . Smokeless tobacco: Never Used  . Alcohol use 0.0 oz/week    5 - 6 Cans of beer per week     Allergies   Asa [aspirin] and Lactose intolerance (gi)   Review of Systems Review of Systems  Constitutional: Negative for chills and fever.  HENT: Negative for congestion and  sore throat.   Eyes: Negative for visual disturbance.  Respiratory: Positive for shortness of breath. Negative for cough and wheezing.   Cardiovascular: Positive for chest pain. Negative for palpitations and leg swelling.  Gastrointestinal: Positive for diarrhea and nausea. Negative for abdominal pain, blood in stool and vomiting.  Genitourinary: Negative for difficulty urinating, dysuria, flank pain, frequency, hematuria and urgency.  Musculoskeletal: Negative for back pain  and neck pain.  Skin: Negative for rash.  Neurological: Positive for headaches. Negative for syncope, weakness, light-headedness and numbness.     Physical Exam Updated Vital Signs BP (!) 133/102   Pulse 74   Temp 97.9 F (36.6 C) (Oral)   Resp 14   Ht 5\' 7"  (1.702 m)   Wt 82.6 kg   SpO2 95%   BMI 28.51 kg/m   Physical Exam  Constitutional: He is oriented to person, place, and time. He appears well-developed and well-nourished. No distress.  Nontoxic appearing.  HENT:  Head: Normocephalic and atraumatic.  Mouth/Throat: Oropharynx is clear and moist.  Eyes: Conjunctivae are normal. Pupils are equal, round, and reactive to light. Right eye exhibits no discharge. Left eye exhibits no discharge.  Neck: Normal range of motion. Neck supple. No JVD present. No tracheal deviation present.  Cardiovascular: Normal rate, regular rhythm, normal heart sounds and intact distal pulses.  Exam reveals no gallop and no friction rub.   No murmur heard. Bilateral radial, posterior tibialis and dorsalis pedis pulses are intact.    Pulmonary/Chest: Effort normal and breath sounds normal. No stridor. No respiratory distress. He has no wheezes. He has no rales. He exhibits no tenderness.  Lungs clear to auscultation bilaterally. Symmetric chest expansion bilaterally. No chest wall tenderness to palpation.  Abdominal: Soft. He exhibits no distension. There is no tenderness. There is no guarding.  Abdomen soft and nontender to palpation.  Musculoskeletal: He exhibits no edema or tenderness.  No lower extremity edema or tenderness.  Lymphadenopathy:    He has no cervical adenopathy.  Neurological: He is alert and oriented to person, place, and time. Coordination normal.  Skin: Skin is warm and dry. Capillary refill takes less than 2 seconds. No rash noted. He is not diaphoretic. No erythema. No pallor.  Psychiatric: He has a normal mood and affect. His behavior is normal.  Nursing note and vitals  reviewed.    ED Treatments / Results  Labs (all labs ordered are listed, but only abnormal results are displayed) Labs Reviewed  BASIC METABOLIC PANEL - Abnormal; Notable for the following:       Result Value   Glucose, Bld 103 (*)    Creatinine, Ser 0.55 (*)    All other components within normal limits  HEPATIC FUNCTION PANEL - Abnormal; Notable for the following:    Total Bilirubin 1.3 (*)    Indirect Bilirubin 1.1 (*)    All other components within normal limits  CBC WITH DIFFERENTIAL/PLATELET  LIPASE, BLOOD  I-STAT TROPOININ, ED    EKG  EKG Interpretation  Date/Time:  Wednesday January 02 2016 06:10:58 EST Ventricular Rate:  81 PR Interval:    QRS Duration: 102 QT Interval:  375 QTC Calculation: 436 R Axis:   66 Text Interpretation:  Sinus rhythm Borderline T abnormalities, inferior leads ST elev, probable normal early repol pattern no significant change since 2014 Confirmed by GOLDSTON MD, SCOTT [phone]([id number]) on 01/02/2016 7:09:28 AM       Radiology Dg Chest 1 View  Result Date: 01/02/2016 CLINICAL DATA:  Possible nodule on  recent chest x-ray versus artifact EXAM: CHEST 1 VIEW COMPARISON:  Film from earlier in the same day FINDINGS: Repeat frontal view was obtained without possible artifacts. The rounded density seen previously is no longer identified consistent with artifact. No acute abnormality is noted. IMPRESSION: No acute abnormality noted. Electronically Signed   By: Alcide Clever M.D.   On: 01/02/2016 09:00   Dg Chest 2 View  Result Date: 01/02/2016 CLINICAL DATA:  Chest pain. EXAM: CHEST  2 VIEW COMPARISON:  Radiographs of April 06, 2012. FINDINGS: The heart size and mediastinal contours are within normal limits. Right lung is clear. Suggestion of nodular density is seen in left upper lobe. No pneumothorax or pleural effusion is noted. The visualized skeletal structures are unremarkable. IMPRESSION: Suggestion of nodular density seen in left upper lobe. Potentially  this may represent overlying artifact or lead marker, and repeat AP radiograph is recommended after ensuring no external overlying objects are present. If this abnormality persists on followup chest radiograph, CT scan of the chest would then be recommended to evaluate for possible neoplasm. Electronically Signed   By: Lupita Raider, M.D.   On: 01/02/2016 07:43    Procedures Procedures (including critical care time)  Medications Ordered in ED Medications  ondansetron (ZOFRAN-ODT) disintegrating tablet 4 mg (4 mg Oral Given 01/02/16 0654)  sodium chloride 0.9 % bolus 500 mL (0 mLs Intravenous Stopped 01/02/16 0740)     Initial Impression / Assessment and Plan / ED Course  I have reviewed the triage vital signs and the nursing notes.  Pertinent labs & imaging results that were available during my care of the patient were reviewed by me and considered in my medical decision making (see chart for details).  Clinical Course     This is a 45 y.o. Male who presents to the ED complaining of waking up with left sided chest pain since 0230 this am. Patient reports he woke up with left sided non-radiating chest pain and then began having nausea and diarrhea. He reports three loose stools today. He reports feeling short of breath with this chest pain. He reports taking a baby aspirin prior to arrival and that currently his chest pain is resolving. He complains of some nausea but has had no vomiting. He denies any abdominal pain. He also reports a headache. He is a former smoker. He denies history of hypertension or hyperlipidemia. On exam the patient is afebrile nontoxic appearing. Abdomen is soft and nontender to palpation. EKG without significant change from his last tracing. Blood work here is unremarkable.  Patient is to be discharged with recommendation to follow up with PCP in regards to today's hospital visit. Chest pain is not likely of cardiac or pulmonary etiology due to presentation, perc  negative, VSS, no tracheal deviation, no JVD or new murmur, RRR, breath sounds equal bilaterally, EKG without acute abnormalities, negative troponin, and negative CXR. Initial checks x-ray had some artifact and repeat x-ray was unremarkable. HEART score is 2. Patient tolerating by mouth after Zofran and fluid bolus. No further nausea. Patient has been advised to return to the ED if chest pain becomes exertional, associated with diaphoresis or nausea, radiates to left jaw/arm, worsens or becomes concerning in any way. Patient appears reliable for follow up and is agreeable to discharge. I advised the patient to follow-up with their primary care provider this week. I advised the patient to return to the emergency department with new or worsening symptoms or new concerns. The patient verbalized understanding and  agreement with plan.       Final Clinical Impressions(s) / ED Diagnoses   Final diagnoses:  Nonspecific chest pain  Diarrhea, unspecified type  Nausea    New Prescriptions New Prescriptions   LACTOBACILLUS (ACIDOPHILUS PROBIOTIC) 10 MG TABS    Take 10 mg by mouth 3 (three) times daily.   ONDANSETRON (ZOFRAN ODT) 4 MG DISINTEGRATING TABLET    Take 1 tablet (4 mg total) by mouth every 8 (eight) hours as needed for nausea or vomiting.     Everlene FarrierWilliam Tyrik Stetzer, PA-C 01/02/16 1015    Pricilla LovelessScott Goldston, MD 01/02/16 [phone]

## 2016-01-02 NOTE — ED Notes (Signed)
Patient transported to X-ray 

## 2016-01-02 NOTE — ED Triage Notes (Signed)
Pt from home woke up at 0300 with chest pain, took 81 mg of aspiring without relief. Pt has headache, dizziness, and nausea

## 2016-01-29 ENCOUNTER — Ambulatory Visit (INDEPENDENT_AMBULATORY_CARE_PROVIDER_SITE_OTHER): Payer: Self-pay | Admitting: Family Medicine

## 2016-01-29 VITALS — BP 144/96 | HR 76 | Temp 98.2°F | Resp 16 | Ht 67.0 in | Wt 179.0 lb

## 2016-01-29 DIAGNOSIS — J019 Acute sinusitis, unspecified: Secondary | ICD-10-CM

## 2016-01-29 DIAGNOSIS — J069 Acute upper respiratory infection, unspecified: Secondary | ICD-10-CM

## 2016-01-29 DIAGNOSIS — M7712 Lateral epicondylitis, left elbow: Secondary | ICD-10-CM

## 2016-01-29 DIAGNOSIS — L409 Psoriasis, unspecified: Secondary | ICD-10-CM

## 2016-01-29 DIAGNOSIS — K219 Gastro-esophageal reflux disease without esophagitis: Secondary | ICD-10-CM

## 2016-01-29 DIAGNOSIS — I10 Essential (primary) hypertension: Secondary | ICD-10-CM

## 2016-01-29 DIAGNOSIS — R0989 Other specified symptoms and signs involving the circulatory and respiratory systems: Secondary | ICD-10-CM

## 2016-01-29 DIAGNOSIS — R3129 Other microscopic hematuria: Secondary | ICD-10-CM

## 2016-01-29 LAB — POCT URINALYSIS DIP (MANUAL ENTRY)
Bilirubin, UA: NEGATIVE
Glucose, UA: NEGATIVE
Ketones, POC UA: NEGATIVE
Nitrite, UA: NEGATIVE
PH UA: 5.5
PROTEIN UA: NEGATIVE
UROBILINOGEN UA: 0.2

## 2016-01-29 LAB — POCT GLYCOSYLATED HEMOGLOBIN (HGB A1C): Hemoglobin A1C: 5.4

## 2016-01-29 LAB — POC MICROSCOPIC URINALYSIS (UMFC): Mucus: ABSENT

## 2016-01-29 MED ORDER — HYDRALAZINE HCL 25 MG PO TABS: 25.0000 mg | ORAL_TABLET | ORAL | 0 refills | Status: DC | PRN

## 2016-01-29 MED ORDER — PREDNISONE 20 MG PO TABS: ORAL_TABLET | ORAL | 0 refills | Status: DC

## 2016-01-29 MED ORDER — AMOXICILLIN 875 MG PO TABS: 875.0000 mg | ORAL_TABLET | Freq: Two times a day (BID) | ORAL | 0 refills | Status: DC

## 2016-01-29 NOTE — Patient Instructions (Addendum)
I would like you to start taking a proton pump inhibitor type medication daily for acid reflux for the next 6 weeks. You can use an over-the-counter product but it should be taking daily 30 minutes before a meal. You may use Protonix, Prilosec, Prevacid, AcipHex, omeprazole, Nexium, pantoprazole, or any other reflux medication ending in -azole.  You may use over-the-counter Zantac, Pepcid, ranitidine, or famotidine in addition but not in substitution. Stay away from Tums, or Maalox, and Pepto-Bismol etc.   Continuing to check your blood pressure approximately 3 times a day - in the morning, after work, and before bed. Record this and bring to recheck appointment in 7-10 days. If your blood pressures spikes are not responding well to the hydralazine, please call or come back in. If your blood pressure is consistently >160/95 come back. If you are developing lightheadedness or dizziness occasionally, especially with position change, this can be a sign that your blood pressure is too low and so checking it if available. If symptoms continue laying down, drinking plenty of fluid, and eating something high in salt and protein can help. If you are having any problems or questions, please call me to let me know.  You should stay away from all cough and cold medications containing decongestant, especially phenylephrine and pseudoephedrine (will be listed under the active ingredient list).  To make it easier, CoricidinHBP is a product tailored towards people with hypertension.  It would be fine to use Mucinex DM or plain Mucinex but not Mucinex D. You can use nasal saline spray frequently but avoid nasal decongestant sprays like Afrin as if used for > 3days they can make the problem worse.   IF you received an x-ray today, you will receive an invoice from [facility]. Please contact [facility] at [phone] with questions or concerns regarding your invoice.   IF you received labwork today,  you will receive an invoice from [facility]. Please contact [facility] at [phone] with questions or concerns regarding your invoice.   Our billing staff will not be able to assist you with questions regarding bills from these companies.  You will be contacted with the lab results as soon as they are available. The fastest way to get your results is to activate your My Chart account. Instructions are located on the last page of this paperwork. If you have not heard from [place] regarding the results in 2 weeks, please contact this office.    Sinus Headache A sinus headache occurs when the paranasal sinuses become clogged or swollen. Paranasal sinuses are air pockets within the bones of the face. Sinus headaches can range from mild to severe. What are the causes? A sinus headache can result from various conditions that affect the sinuses, such as:  Colds.  Sinus infections.  Allergies. What are the signs or symptoms? The main symptom of this condition is a headache that may feel like pain or pressure in the face, forehead, ears, or upper teeth. People who have a sinus headache often have other symptoms, such as:  Congested or runny nose.  Fever.  Inability to smell. Weather changes can make symptoms worse. How is this diagnosed? This condition may be diagnosed based on:  A physical exam and medical history.  Imaging tests, such as a CT scan and MRI, to check for problems with the sinuses.  A specialist may look into the sinuses with a tool that has a camera (endoscopy). How is this treated? Treatment for this condition depends on the cause.  Sinus pain that is caused by a sinus infection may be treated with antibiotic medicine.  Sinus pain that is caused by allergies may be helped by allergy medicines (antihistamines) and medicated nasal sprays.  Sinus pain that is caused by congestion may be helped by flushing the nose and sinuses with saline solution. Follow these instructions  at home:  Take medicines only as directed by your health care provider.  If you were prescribed an antibiotic medicine, finish all of it even if you start to feel better.  If you have congestion, use a nasal spray to help reduce pressure.  If directed, apply a warm, moist washcloth to your face to help relieve pain. Contact a health care provider if:  You have headaches more than one time each week.  You have sensitivity to light or sound.  You have a fever.  You feel sick to your stomach (nauseous) or you throw up (vomit).  Your headaches do not get better with treatment. Many people think that they have a sinus headache when they actually have migraines or tension headaches. Get help right away if:  You have vision problems.  You have sudden, severe pain in your face or head.  You have a seizure.  You are confused.  You have a stiff neck. This information is not intended to replace advice given to you by your health care provider. Make sure you discuss any questions you have with your health care provider. Document Released: 02/21/2004 Document Revised: 09/09/2015 Document Reviewed: 01/09/2014 Elsevier Interactive Patient Education  2017 Elsevier Inc.  Preventing Hypertension Hypertension, commonly called high blood pressure, is when the force of blood pumping through the arteries is too strong. Arteries are blood vessels that carry blood from the heart throughout the body. Over time, hypertension can damage the arteries and decrease blood flow to important parts of the body, including the brain, heart, and kidneys. Often, hypertension does not cause symptoms until blood pressure is very high. For this reason, it is important to have your blood pressure checked on a regular basis. Hypertension can often be prevented with diet and lifestyle changes. If you already have hypertension, you can control it with diet and lifestyle changes, as well as medicine. What nutrition changes  can be made? Maintain a healthy diet. This includes:  Eating less salt (sodium). Ask your health care provider how much sodium is safe for you to have. The general recommendation is to consume less than 1 tsp (2,300 mg) of sodium a day.  Do not add salt to your food.  Choose low-sodium options when grocery shopping and eating out.  Limiting fats in your diet. You can do this by eating low-fat or fat-free dairy products and by eating less red meat.  Eating more fruits, vegetables, and whole grains. Make a goal to eat:  1-2 cups of fresh fruits and vegetables each day.  3-4 servings of whole grains each day.  Avoiding foods and beverages that have added sugars.  Eating fish that contain healthy fats (omega-3 fatty acids), such as mackerel or salmon. If you need help putting together a healthy eating plan, try the DASH diet. This diet is high in fruits, vegetables, and whole grains. It is low in sodium, red meat, and added sugars. DASH stands for Dietary Approaches to Stop Hypertension. What lifestyle changes can be made?  Lose weight if you are overweight. Losing just 3?5% of your body weight can help prevent or control hypertension.  For example, if your present  weight is 200 lb (91 kg), a loss of 3-5% of your weight means losing 6-10 lb (2.7-4.5 kg).  Ask your health care provider to help you with a diet and exercise plan to safely lose weight.  Get enough exercise. Do at least 150 minutes of moderate-intensity exercise each week.  You could do this in short exercise sessions several times a day, or you could do longer exercise sessions a few times a week. For example, you could take a brisk 10-minute walk or bike ride, 3 times a day, for 5 days a week.  Find ways to reduce stress, such as exercising, meditating, listening to music, or taking a yoga class. If you need help reducing stress, ask your health care provider.  Do not smoke. This includes e-cigarettes. Chemicals in  tobacco and nicotine products raise your blood pressure each time you smoke. If you need help quitting, ask your health care provider.  Avoid alcohol. If you drink alcohol, limit alcohol intake to no more than 1 drink a day for nonpregnant women and 2 drinks a day for men. One drink equals 12 oz of beer, 5 oz of wine, or 1 oz of hard liquor. Why are these changes important? Diet and lifestyle changes can help you prevent hypertension, and they may make you feel better overall and improve your quality of life. If you have hypertension, making these changes will help you control it and help prevent major complications, such as:  Hardening and narrowing of arteries that supply blood to:  Your heart. This can cause a heart attack.  Your brain. This can cause a stroke.  Your kidneys. This can cause kidney failure.  Stress on your heart muscle, which can cause heart failure. What can I do to lower my risk?  Work with your health care provider to make a hypertension prevention plan that works for you. Follow your plan and keep all follow-up visits as told by your health care provider.  Learn how to check your blood pressure at home. Make sure that you know your personal target blood pressure, as told by your health care provider. How is this treated? In addition to diet and lifestyle changes, your health care provider may recommend medicines to help lower your blood pressure. You may need to try a few different medicines to find what works best for you. You also may need to take more than one medicine. Take over-the-counter and prescription medicines only as told by your health care provider. Where to find support: Your health care provider can help you prevent hypertension and help you keep your blood pressure at a healthy level. Your local hospital or your community may also provide support services and prevention programs. The American Heart Association offers an online support network at:  https://www.lee.net/ Where to find more information: Learn more about hypertension from:  National Heart, Lung, and Blood Institute: https://www.peterson.org/  Centers for Disease Control and Prevention: AboutHD.co.nz  American Academy of Family Physicians: http://familydoctor.org/familydoctor/en/diseases-conditions/high-blood-pressure.printerview.all.html Learn more about the DASH diet from:  National Heart, Lung, and Blood Institute: WedMap.it Contact a health care provider if:  You think you are having a reaction to medicines you have taken.  You have recurrent headaches or feel dizzy.  You have swelling in your ankles.  You have trouble with your vision. Summary  Hypertension often does not cause any symptoms until blood pressure is very high. It is important to get your blood pressure checked regularly.  Diet and lifestyle changes are the most important  steps in preventing hypertension.  By keeping your blood pressure in a healthy range, you can prevent complications like heart attack, heart failure, stroke, and kidney failure.  Work with your health care provider to make a hypertension prevention plan that works for you. This information is not intended to replace advice given to you by your health care provider. Make sure you discuss any questions you have with your health care provider. Document Released: 01/28/2015 Document Revised: 09/24/2015 Document Reviewed: 09/24/2015 Elsevier Interactive Patient Education  2017 Elsevier Inc.   Hipertensin (Hypertension) La hipertensin, conocida comnmente como presin arterial alta, se produce cuando la sangre bombea en las arterias con mucha fuerza. Las arterias son los vasos sanguneos que transportan la sangre desde el corazn hacia todas las partes del cuerpo. Una lectura de la presin arterial consiste en un nmero  ms alto sobre un nmero ms bajo, por ejemplo, 110/72. El nmero ms alto (presin sistlica) corresponde a la presin interna de las arterias cuando el corazn [place]. El nmero ms bajo (presin diastlica) corresponde a la presin interna de las arterias cuando el corazn se relaja. En condiciones ideales, la presin arterial debe ser inferior a 120/80. La hipertensin fuerza al corazn a trabajar ms para Marine scientist. Las arterias pueden estrecharse o ponerse rgidas. La hipertensin no tratada o no controlada puede causar infarto de miocardio, ictus, enfermedad renal y otros problemas. FACTORES DE RIESGO Algunos factores de riesgo de hipertensin son controlables, pero otros no lo son. Entre los factores de riesgo que usted no puede Chief Operating Officer, se [employer] siguientes:  La raza. El riesgo es mayor para las Statistician.  La edad. Los riesgos aumentan con la edad.  El sexo. Antes de los 45aos, los hombres corren ms [employer]. Despus de los 65aos, las mujeres corren ms [employer]. Entre los factores de riesgo que usted puede Chief Operating Officer, se [employer] siguientes:  No hacer la cantidad suficiente de actividad fsica o ejercicio.  Tener sobrepeso.  Consumir mucha grasa, azcar, caloras o sal en la dieta.  Beber alcohol en exceso. SIGNOS Y SNTOMAS Por lo general, la hipertensin no causa signos o sntomas. La hipertensin arterial demasiado alta (crisis hipertensiva) puede causar dolor de cabeza, ansiedad, falta de aire y hemorragia nasal. DIAGNSTICO Para detectar si usted tiene hipertensin, el mdico le medir la presin arterial mientras est sentado, con el brazo levantado a la altura del corazn. Debe medirla al [facility] veces en el mismo brazo. Determinadas condiciones pueden causar una diferencia de presin arterial entre el brazo izquierdo y Aeronautical engineer. El hecho de tener una sola lectura de la presin arterial ms alta que  lo normal no significa que Research scientist (physical sciences). Si no est claro si tiene hipertensin arterial, es posible que se le pida que regrese otro da para volver a controlarle la presin arterial. O bien se le puede pedir que se controle la presin arterial en su casa durante 1 o ms meses. TRATAMIENTO El tratamiento de la hipertensin arterial incluye hacer cambios en el estilo de vida y, posiblemente, tomar medicamentos. Un estilo de vida saludable puede ayudar a bajar la presin arterial alta. Quiz deba cambiar algunos hbitos. Los cambios en el estilo de vida pueden incluir lo siguiente:  Seguir la dieta DASH. Esta dieta tiene un alto contenido de frutas, verduras y Radiation protection practitioner. Incluye poca cantidad de sal, carnes rojas y azcares agregados.  Mantenga el consumo de sodio por debajo de 2300 mg por da.  Realizar al [employer] 30 y 45 minutos de ejercicio [place], 4 veces por semana como mnimo.  Perder peso, si es necesario.  No fumar.  Limitar el consumo de bebidas alcohlicas.  Aprender formas de reducir el estrs. El mdico puede recetarle medicamentos si los cambios en el estilo de vida no son suficientes para Museum/gallery curator la presin arterial y si una de las siguientes afirmaciones es verdadera:  Cleone Slim 18 y 1 aos y su presin arterial sistlica est por encima de 140.  Tiene 60 aos o ms y su presin arterial sistlica est por encima de 150.  Su presin arterial diastlica est por encima de 90.  Tiene diabetes y su presin arterial sistlica est por encima de 140 o su presin arterial diastlica est por encima de 90.  Tiene una enfermedad renal y su presin arterial est por encima de 140/90.  Tiene una enfermedad cardaca y su presin arterial est por encima de 140/90. La presin arterial deseada puede variar en funcin de las enfermedades, la edad y otros factores personales. INSTRUCCIONES PARA EL CUIDADO EN EL HOGAR  Haga que le midan de nuevo  la presin arterial segn las indicaciones del mdico.  Tome los medicamentos solamente como se lo haya indicado el mdico. Siga cuidadosamente las indicaciones. Los medicamentos para la presin arterial deben tomarse segn las indicaciones. Los medicamentos pierden eficacia al omitir las dosis. El hecho de omitir las dosis tambin [place] el riesgo de otros problemas.  No fume.  Contrlese la presin arterial en su casa segn las indicaciones del mdico. SOLICITE ATENCIN MDICA SI:  Piensa que tiene una reaccin alrgica a los medicamentos.  Tiene mareos o dolores de cabeza con Naval architect.  Tiene hinchazn en los tobillos.  Tiene problemas de visin. SOLICITE ATENCIN MDICA DE INMEDIATO SI:  Siente un dolor de cabeza intenso o confusin.  Siente debilidad inusual, adormecimiento o que Hospital doctor.  Siente dolor intenso en el pecho o en el abdomen.  Vomita repetidas veces.  Tiene dificultad para respirar. ASEGRESE DE QUE:  Comprende estas instrucciones.  Controlar su afeccin.  Recibir ayuda de inmediato si no mejora o si empeora. Esta informacin no tiene Theme park manager el consejo del mdico. Asegrese de hacerle al mdico cualquier pregunta que tenga. Document Released: 01/13/2005 Document Revised: 05/30/2014 Document Reviewed: 11/05/2012 Elsevier Interactive Patient Education  2017 Elsevier Inc.   [place] su presin arterial (Managing Your Hypertension) La presin arterial es la medida de la fuerza de la sangre al presionar contra las paredes de las arterias. Las arterias son tubos musculares que estn dentro del sistema circulatorio. La presin arterial no es constante. Se eleva con la actividad, la excitacin o el nerviosismo y disminuye durante el sueo y Facilities manager. Si los valores de medicin de la presin arterial se mantienen por arriba de lo normal por [employer], hay riesgo de [address] La presin arterial alta (hipertensin) es  una enfermedad de larga duracin (crnica) en la que la presin arterial est elevada.  La lectura de la presin arterial se registra con dos nmeros, por ejemplo 120 sobre 80 (o 120/80). El primer nmero, el ms alto, es la presin sistlica. Es la medida de la presin de las arterias cuando el corazn late. El segundo nmero, el ms bajo, es la presin diastlica. Es la medida de la presin en las arterias cuando el corazn se relaja entre latidos.  Es importante mantener la presin arterial en un rango normal para Personal assistant  en general y otros problemas de salud, como enfermedades del corazn e ictus. Cuando no se controla la presin arterial, el corazn trabaja ms de lo normal. La hipertensin arterial es una enfermedad muy comn en los adultos debido a que tiende a Administrator, Civil Serviceaumentar con la edad. Hombres y mujeres son igualmente propensos a tener hipertensin, pero en diferentes momentos de la vida. Antes de los 45 aos, los hombres son ms propensos a sufrir hipertensin. Despus de 65 aos de edad, las mujeres tienen ms probabilidades de padecerla. La hipertensin es Weyerhaeuser Companymuy comn en los afroamericanos. Esta enfermedad generalmente no manifiesta signos ni sntomas. Generalmente se desconoce la causa. El mdico podr indicarle un plan para mantener la presin arterial en un rango normal y saludable.  ETAPAS DE PRESIN ARTERIAL  La presin arterial se clasifica en cuatro etapas: normal, prehipertensin, etapa 1 y etapa 2. Se puede leer la presin arterial para determinar qu tipo de tratamiento, si se indicara, es necesario. Las opciones apropiadas para el tratamiento estn vinculadas a estas cuatro etapas:  Normal   Presin sistlica (mm Hg): por debajo de 120.  Presin diastlica (mm Hg): por debajo de 80. Prehipertensin   Presin sistlica (mm Hg): 120 a 139.  Presin diastlica (mm Hg): 80 a 89. Etapa1   Presin sistlica (mm Hg): 140 a 159.  Presin diastlica (mm Hg): 90 a 99. Etapa2    Presin sistlica (mm Hg): 160 o ms.  Presin diastlica (mm Hg): 100 o ms. RIESGOS RELACIONADOS CON LA PRESIN ARTERIAL ALTA  Controlar la presin arterial es una responsabilidad importante. La hipertensin no controlada puede llevar a:   Ataques cardacos.  Ictus.  Debilitamiento de los vasos sanguneos (aneurisma).  Insuficiencia cardaca.  Dao renal.  Dao ocular.  Sndrome metablico.  Problemas de memoria y concentracin. CMO CONTROLAR LA PRESIN ARTERIAL  La presin arterial puede ser controlada efectivamente con cambios en el estilo de vida y de medicamentos (si es necesario). El Firefightermdico le indicar un plan para bajar la presin arterial al rango normal. Su plan debera incluir lo siguiente:  Educacin   Lea toda la informacin proporcionada por sus mdicos acerca de cmo controlar la presin arterial.  Infrmese sobre las ltimas recomendaciones de pautas y Priddytratamiento. Continuamente se hacen nuevas investigaciones para definir con ms precisin los riesgos y los tratamientos para la hipertensin arterial. Cambiosen el estilo de vida  Control del [place]peso.  No fumar.  Mantenerse fsicamente activo.  Disminuir la cantidad de sal de la dieta.  Reducir las situaciones de estrs.  Controlar las enfermedades crnicas, como el colesterol alto o la diabetes.  Reducir el consumo de alcohol. Medicamentos  Estn disponibles diferentes medicamentos (medicamentos antihipertensivos) para que la presin arterial quede dentro de un rango normal. Comunicacin   Revise con su mdico todos los medicamentos que toma ya que puede haber efectos secundarios o interacciones.  Hable con su mdico acerca de la dieta, hbitos de ejercicio y otros factores del estilo de vida que pueden contribuir a la hipertensin arterial.  OceanographerConcurra regularmente a la consulta con el profesional. El mdico puede ayudarle a crear y Dawayne Patriciaajustar su plan para controlar la presin arterial  alta. RECOMENDACIONES PARA EL TRATAMIENTO Y CONTROL  Las siguientes recomendaciones se basan en las pautas actuales para controlar la hipertensin arterial en adultos no gestantes. Utilice estas recomendaciones para determinar el perodo de seguimiento adecuado o la opcin de tratamiento basada en la lectura de su presin arterial. Podr conversar sobre estas opciones con su mdico.   Presin  sistlica de 120 a 139 o presin diastlica de 80 a 89: Concurra a las visitas de control, segn las indicaciones.  Presin sistlica de 140 a 160 o presin diastlica de 90 a 100: Haga una visita de control con el profesional dentro de [address]  Presin sistlica por arriba de 160 o presin diastlica por arriba de 100: Haga una visita de control con el profesional dentro de un mes.  Presin sistlica por arriba de 180 o presin diastlica por arriba de 110: Considere la posibilidad de seguir una terapia antihipertensiva; concurra a una visita de control con su mdico dentro de 1 semana.  Presin sistlica por arriba de 200 o presin diastlica por arriba de 120: Comience el tratamiento antihipertensivo; concurra una visita de control con su mdico dentro de 1 semana. Esta informacin no tiene Theme park manager el consejo del mdico. Asegrese de hacerle al mdico cualquier pregunta que tenga. Document Released: 10/08/2011 Elsevier Interactive Patient Education  2017 [employer]. Plan de alimentacin DASH (DASH Eating Plan) DASH es la sigla en ingls de "Enfoques Alimentarios para Detener la Hipertensin". El plan de alimentacin DASH ha demostrado bajar la presin arterial elevada (hipertensin). Los beneficios adicionales para la salud pueden incluir la disminucin del riesgo de diabetes mellitus tipo2, enfermedades cardacas e ictus. Este plan tambin puede ayudar a Geophysical data processor. QU DEBO SABER ACERCA DEL PLAN DE ALIMENTACIN DASH? Para el plan de alimentacin DASH, seguir las siguientes pautas  generales:  Elija los alimentos que contienen menos de 150 miligramos de sodio por porcin (segn se indica en la etiqueta de los alimentos).  Use hierbas o aderezos sin sal, en lugar de sal de mesa o sal marina.  Consulte al mdico o farmacutico antes de usar sustitutos de la sal.  Consuma los productos con menor contenido de sodio. Estos productos suelen estar etiquetados como "bajo en sodio" o "sin agregado de sal".  Coma alimentos frescos. No consuma una gran cantidad de alimentos enlatados.  Coma ms verduras, frutas y productos lcteos con bajo contenido de [place].  Elija los cereales integrales. Busque la palabra "integral" en Estate agent de la lista de ingredientes.  Elija el pescado y el pollo o el pavo sin piel ms a menudo que las carnes rojas. Limite el consumo de pescado, carne de ave y carne a 6onzas (170g) por Futures trader.  Limite el consumo de dulces, postres, azcares y bebidas azucaradas.  Elija las grasas saludables para el corazn.  Consuma ms comida casera y menos de restaurante, de buf y comida rpida.  Limite el consumo de alimentos fritos.  No fra los alimentos. A la hora de cocinarlos, opte por hornearlos, hervirlos, grillarlos y asarlos a Patent attorney.  Cuando coma en un restaurante, pida que preparen su comida con menos sal o, en lo posible, sin nada de sal. QU ALIMENTOS PUEDO COMER? Pida ayuda a un nutricionista para conocer las necesidades calricas individuales. Cereales  Pan de salvado o integral. Arroz integral. Pastas de salvado o integrales. Quinua, trigo burgol y cereales integrales. Cereales con bajo contenido de sodio. Tortillas de harina de maz o de salvado. Pan de maz integral. Galletas saladas integrales. Galletas con bajo contenido de [place]. Vegetales  Verduras frescas o congeladas (crudas, al vapor, asadas o grilladas). Jugos de tomate y verduras con contenido bajo o reducido de sodio. Pasta y salsa de tomate con contenido bajo o  reducido de sodio. Verduras enlatadas con bajo contenido de sodio o reducido de sodio. Nils Pyle  Nils Pyle frescas, en conserva (  en su jugo natural) o frutas congeladas. Carnes y otros productos con protenas  Carne de res molida (al 85% o ms [place]), carne de res de animales alimentados con pastos o carne de res sin la grasa. Pollo o pavo sin piel. Carne de pollo o de [place]. Cerdo sin la grasa. Todos los pescados y frutos de mar. Huevos. Porotos, guisantes o lentejas secos. Frutos secos y semillas sin sal. Frijoles enlatados sin sal. Lcteos  Productos lcteos con bajo contenido de grasas, como Pulcifer o al 1%, quesos reducidos en grasas o al 2%, ricota con bajo contenido de grasas o [employer], o yogur natural con bajo contenido de [place]. Quesos con contenido bajo o reducido de sodio. Grasas y Financial risk analyst en barra que no contengan grasas trans. [place] y alios para ensaladas livianos o reducidos en grasas (reducidos en sodio). [place]. Aceites de crtamo, oliva o canola. Mantequilla natural de man o almendra. Otros  Palomitas de maz y pretzels sin sal. Los artculos mencionados arriba pueden no ser [employer] de las bebidas o los alimentos recomendados. Comunquese con el nutricionista para conocer ms opciones.  QU ALIMENTOS NO SE RECOMIENDAN? Cereales  Pan blanco. Pastas blancas. Arroz blanco. Pan de maz refinado. Bagels y croissants. Galletas saladas que contengan grasas trans. Vegetales  Vegetales con crema o fritos. Verduras en salsa de [place]. Verduras enlatadas comunes. Pasta y salsa de tomate en lata comunes. Jugos comunes de tomate y de verduras. Nils Pyle  Fruta enlatada en almbar liviano o espeso. Jugo de frutas. Carnes y otros productos con protenas  Cortes de carne con Holiday representative. Costillas, alas de pollo, tocineta, salchicha, mortadela, salame, chinchulines, tocino, perros calientes, salchichas alemanas y embutidos envasados. Frutos secos y  semillas con sal. Frijoles con sal en lata. Lcteos  Leche entera o al 2%, crema, mezcla de [address] [place] y crema, y queso crema. Yogur entero o endulzado. Quesos o queso azul con alto contenido de Neurosurgeon. Cremas no lcteas y coberturas batidas. Quesos procesados, quesos para untar o cuajadas. Condimentos  Sal de cebolla y ajo, sal condimentada, sal de mesa y sal marina. Salsas en lata y envasadas. Salsa [place]. Salsa trtara. Salsa barbacoa. Salsa teriyaki. Salsa de soja, incluso la que tiene contenido reducido de [place]. Salsa de carne. Salsa de pescado. Salsa de [place]. Salsa rosada. Rbano picante. Ketchup y mostaza. Saborizantes y tiernizantes para carne. Caldo en cubitos. Salsa picante. Salsa tabasco. Adobos. Aderezos para tacos. Salsas. Grasas y [address], [place] en barra, [place]otes de [place], [place], [place] clarificada y Steffanie Rainwater de tocino. Aceites de coco, de palmiste o de palma. Aderezos comunes para ensalada. Otros  Pickles y [place]. Palomitas de maz y pretzels con sal. Los artculos mencionados arriba pueden no ser [employer] de las bebidas y los alimentos que se Theatre stage manager. Comunquese con el nutricionista para obtener ms informacin.  DNDE Raelyn Mora MS INFORMACIN? Instituto Nacional del [place], del Pulmn y de Risk manager (National Heart, Lung, and Blood Institute): [email or web address] Esta informacin no tiene Theme park manager el consejo del mdico. Asegrese de hacerle al mdico cualquier pregunta que tenga. Document Released: 01/02/2011 Document Revised: 05/07/2015 Document Reviewed: 11/17/2012 Elsevier Interactive Patient Education  2017 [employer].

## 2016-01-29 NOTE — Progress Notes (Addendum)
Subjective:  By signing my name below, I, Essence Howell, attest that this documentation has been prepared under the direction and in the presence of Norberto Sorenson, MD Electronically Signed: Charline Bills, ED Scribe 01/29/2016 at 11:26 AM.   Patient ID: Caleb Pace, male    DOB: Sep 21, 1970, 46 y.o.   MRN: [id number]  Chief Complaint  Patient presents with  . Hypertension    follow up on hosptial visit    HPI HPI Comments: Caleb Pace is a 46 y.o. male, with a h/o HTN, who presents to the Emergency Department for a follow-up on elevated BP. Pt was seen in the ER 1 month ago for atypical left-sided non-radiating chest pain that awoke him from his sleep, accompanied with nausea and diarrhea. Pt took aspirin and symptoms resolved by the time EMS arrived. Noted to have some ST elevation on EKG but further review showed that this was consistent with repolarization and unchanged from prior recordings. Pt does have a h/o GERD and esophagitis; currently on PPI. He doesn't have health insurance, PCP or recent health care, despite h/o HTN and hyperlipidemia. Symptoms attributed to noncardiac chest pain and pt was instructed to follow-up with PCP in 1 week. His BP was 140-150/90-100 at that visit.   Pt called EMS yesterday when he woke yesterday with a HA and an elevated BP of 190/120. He also reported chest pain at that time which has since resolved. Pt still reports some nausea and sinus-like symptoms onset this morning. He reports average systolic BP readings of 120 but states that his BP spiked up again this morning. Triage BP: 144/96. He also reports occasional dizziness with sudden standing and bending over the past few months. Pt denies visual disturbances, tinnitus, sob, abdominal pain, vomiting, leg swelling, sleep disturbances. He is a former smoker. Pt denies caffeine use. He reports some alcohol use; a few beers before bed. Pt exercises regularly.   L Elbow Pt also presents with constant  left elbow pain that is exacerbated with movement. He reports increased pain upon waking in the morning. Pt reports some weakness in his left hand but denies numbness. Pt is right hand dominant. He has used an epicondylitis band continually for several months without any improvement in symptoms. No NSAIDs.  GU Pt reports right flank pain onset yesterday. He denies changes in his urine, h/o kidney stones or UTIs.   Past Medical History:  Diagnosis Date  . GERD (gastroesophageal reflux disease)   . Helicobacter pylori gastritis    Current Outpatient Prescriptions on File Prior to Visit  Medication Sig Dispense Refill  . Lactobacillus (ACIDOPHILUS PROBIOTIC) 10 MG TABS Take 10 mg by mouth 3 (three) times daily. (Patient not taking: Reported on 01/29/2016) 30 tablet 0  . lansoprazole (PREVACID) 30 MG capsule Take 1 capsule (30 mg total) by mouth 2 (two) times daily before a meal. (Patient not taking: Reported on 01/29/2016) 60 capsule 1  . ondansetron (ZOFRAN ODT) 4 MG disintegrating tablet Take 1 tablet (4 mg total) by mouth every 8 (eight) hours as needed for nausea or vomiting. (Patient not taking: Reported on 01/29/2016) 10 tablet 0  . ranitidine (ZANTAC) 300 MG tablet Take 1 tablet (300 mg total) by mouth at bedtime. (Patient not taking: Reported on 01/29/2016) 30 tablet 0  . triamcinolone ointment (KENALOG) 0.5 % Apply 1 application topically 2 (two) times daily. (Patient not taking: Reported on 01/29/2016) 45 g 5   No current facility-administered medications on file prior to visit.  Allergies  Allergen Reactions  . Asa [Aspirin]     Upset stomach   . Lactose Intolerance (Gi)     Upset stomach    Review of Systems  HENT: Negative for tinnitus.   Eyes: Negative for visual disturbance.  Respiratory: Negative for shortness of breath.   Cardiovascular: Positive for chest pain (resolved). Negative for leg swelling.  Gastrointestinal: Positive for diarrhea (resolved) and nausea. Negative for  abdominal pain and vomiting.  Genitourinary: Positive for flank pain.  Musculoskeletal: Positive for arthralgias.  Neurological: Positive for dizziness (occasional), weakness and headaches. Negative for numbness.  Psychiatric/Behavioral: Negative for sleep disturbance.      Objective:   Physical Exam  Constitutional: He is oriented to person, place, and time. He appears well-developed and well-nourished. No distress.  HENT:  Head: Normocephalic and atraumatic.  Right Ear: Tympanic membrane is injected.  Left Ear: Tympanic membrane is injected.  Nose: Mucosal edema (and erythema) present.  Mouth/Throat: Posterior oropharyngeal erythema present.  Eyes: Conjunctivae and EOM are normal.  Neck: Neck supple. No tracheal deviation present. No thyromegaly present.  Cardiovascular: Normal rate, regular rhythm and normal heart sounds.   Pulmonary/Chest: Effort normal and breath sounds normal. No respiratory distress.  Abdominal: Soft. He exhibits no distension. There is no hepatosplenomegaly. There is no tenderness. There is no CVA tenderness. A hernia (umbilical) is present.  +diastasis recti  Musculoskeletal: Normal range of motion.  L arm: tenderness over the lateral epicondyl and with wrist extension. Weakness in the 3rd-5th fingers than in 1st-2nd. 2+ radial and ulnar pulses.   Lymphadenopathy:       Head (right side): Tonsillar adenopathy present.       Head (left side): Tonsillar (greater than R) adenopathy present.    He has cervical adenopathy (anterior).  Neurological: He is alert and oriented to person, place, and time.  Skin: Skin is warm and dry.  Psychiatric: He has a normal mood and affect. His behavior is normal.  Nursing note and vitals reviewed.  EKG: Normal sinus rhythm. No ischemic change. Partial RBBB. Unchanged from prior.   Negative orthostatics: Orthostatic VS for the past 24 hrs:  BP- Lying Pulse- Lying BP- Sitting Pulse- Sitting BP- Standing at 0 minutes Pulse-  Standing at 0 minutes  01/29/16 1156 (!) 154/97 66 (!) 149/100 68 (!) 162/101 69   Results for orders placed or performed in visit on 01/29/16  POCT urinalysis dipstick  Result Value Ref Range   Color, UA yellow yellow   Clarity, UA clear clear   Glucose, UA negative negative   Bilirubin, UA negative negative   Ketones, POC UA negative negative   Spec Grav, UA <=1.005    Blood, UA moderate (A) negative   pH, UA 5.5    Protein Ur, POC negative negative   Urobilinogen, UA 0.2    Nitrite, UA Negative Negative   Leukocytes, UA small (1+) (A) Negative  POCT Microscopic Urinalysis ([facility])  Result Value Ref Range   WBC,UR,HPF,POC None None WBC/hpf   RBC,UR,HPF,POC None None RBC/hpf   Bacteria None None, Too numerous to count   Mucus Absent Absent   Epithelial Cells, UR Per Microscopy Few (A) None, Too numerous to count cells/hpf  POCT glycosylated hemoglobin (Hb A1C)  Result Value Ref Range   Hemoglobin A1C 5.4    BP (!) 144/96   Pulse 76   Temp 98.2 F (36.8 C) (Oral)   Resp 16   Ht 5\' 7"  (1.702 m)   Wt 179 lb (81.2  kg)   SpO2 96%   BMI 28.04 kg/m     Assessment & Plan:   1. Labile blood pressure   2. Malignant hypertension   3. Gastroesophageal reflux disease, esophagitis presence not specified   4. Upper respiratory tract infection, unspecified type   5. Microscopic hematuria - unknown etiology, pt does report a little right flank pain but nothing sig, no h/o renal colic or kidney stones.  Wanted to obtain urine culture but urine was discarded. Recheck renal function today. Recheck UA with Clx at f/u in 1-2 wks and will likely need imaging and urology referral if persists.  6. Lateral epicondylitis of left elbow   7. Acute non-recurrent sinusitis, unspecified location   8.     ? Of Psoriasis- seen in care everywhere while reviewing chart after visit that pt may be in a clinical study for this? Discuss at f/u.  One month prior patient was seen in the ER for an episode of  atypical left sided chest pain accompanied by nausea and diarrhea which spontaneously resolved. He does have a long-standing history of GERD. However yesterday patient woke with severe headache. He checked his blood pressure and found it to be 190/120 so EMS was called out the house. Cardiac strip was ran and blood pressure rechecked decreased to 170/112. He reports it then decreased to normal 130s-140s systolic throughout the day. Patient reassured that this was not likely an acute cardiovascular or cerebrovascular episode and so was not transported and instructed to follow-up with his PCP. He again awoke this morning with another severe frontal headache and malignant BP spike so came in for further evaluation.  He is fasting and requests lipid panel. Patient states he does have a distant history of mild hypertension and hyperlipidemia but has never been treated and he does not make any distinct lifestyle attempts to control the chronic diseases. He has active working in Event organisercarpentry but has an admittedly VERY poor diet and drinks a mild amount of beer on a regular basis with no formal exercise.  Blood pressure does continue to be elevated today but similar to prior.  I wonder if his a.m. BP spikes for the last 2d may be due to a rather marked sinus infection present on exam so will treat. Interpretation of his malignant a.m. BP readings yesterday is complicated by the fact that otherwise he has not checked his blood pressure at home at all and so we  do not know whether this is an acute or chronic event. Patient will continue to monitor and record blood pressure several times a day at home. He can take hydralazine if blood pressure spikes greater than 160/100. Recheck in 1-2 weeks, sooner if worse. Work on Altria Grouphealthy diet. Do not use OTC cough or cold medicines.  No NSAIDs but hopefully prednisone for severe sinusitis also help the left upper condyle pain. Unfortunately it sounds that he has failed several  months of an epicondylitis band. Granted it is unlikely he was doing exercises during its use but at this point patient likely warrants x-ray at f/u and I will refer him to my sports medicine colleague Dr. Neva SeatGreene.  Orders Placed This Encounter  Procedures  . Lipid panel    Order Specific Question:   Has the patient fasted?    Answer:   Yes  . Comprehensive metabolic panel  . Orthostatic vital signs  . POCT urinalysis dipstick  . POCT Microscopic Urinalysis ([facility])  . POCT glycosylated hemoglobin (Hb A1C)  . POCT  SEDIMENTATION RATE  . EKG 12-Lead    Meds ordered this encounter  Medications  . amoxicillin (AMOXIL) 875 MG tablet    Sig: Take 1 tablet (875 mg total) by mouth 2 (two) times daily.    Dispense:  14 tablet    Refill:  0  . predniSONE (DELTASONE) 20 MG tablet    Sig: Take 3 tabs qd x 3d, then 2 tabs qd x 3d then 1 tab qd x 3d.    Dispense:  18 tablet    Refill:  0  . hydrALAZINE (APRESOLINE) 25 MG tablet    Sig: Take 1 tablet (25 mg total) by mouth every hour as needed. For BP >160 on the top or >100 on the bottom    Dispense:  60 tablet    Refill:  0    I personally performed the services described in this documentation, which was scribed in my presence. The recorded information has been reviewed and considered, and addended by me as needed.   Norberto Sorenson, M.D.  [facility] [address] [place], [place] [address] [phone] phone [phone] fax  01/30/16 4:19 PM

## 2016-01-30 DIAGNOSIS — L409 Psoriasis, unspecified: Secondary | ICD-10-CM | POA: Insufficient documentation

## 2016-01-30 LAB — LIPID PANEL
CHOLESTEROL TOTAL: 198 mg/dL (ref 100–199)
Chol/HDL Ratio: 4 ratio units (ref 0.0–5.0)
HDL: 49 mg/dL (ref >39)
LDL Calculated: 120 mg/dL — ABNORMAL HIGH (ref 0–99)
Triglycerides: 146 mg/dL (ref 0–149)
VLDL Cholesterol Cal: 29 mg/dL (ref 5–40)

## 2016-01-31 LAB — COMPREHENSIVE METABOLIC PANEL

## 2016-02-02 LAB — COMPREHENSIVE METABOLIC PANEL
A/G RATIO: 1.7 (ref 1.2–2.2)
ALBUMIN: 4.3 g/dL (ref 3.5–5.5)
ALT: 46 IU/L — ABNORMAL HIGH (ref 0–44)
AST: 25 IU/L (ref 0–40)
Alkaline Phosphatase: 78 IU/L (ref 39–117)
BUN / CREAT RATIO: 15 (ref 9–20)
BUN: 10 mg/dL (ref 6–24)
Bilirubin Total: 0.8 mg/dL (ref 0.0–1.2)
CALCIUM: 9.6 mg/dL (ref 8.7–10.2)
CO2: 21 mmol/L (ref 18–29)
CREATININE: 0.65 mg/dL — AB (ref 0.76–1.27)
Chloride: 98 mmol/L (ref 96–106)
GFR calc Af Amer: 137 mL/min/{1.73_m2} (ref >59)
GFR, EST NON AFRICAN AMERICAN: 118 mL/min/{1.73_m2} (ref >59)
GLOBULIN, TOTAL: 2.6 g/dL (ref 1.5–4.5)
Glucose: 102 mg/dL — ABNORMAL HIGH (ref 65–99)
POTASSIUM: 4.3 mmol/L (ref 3.5–5.2)
SODIUM: 141 mmol/L (ref 134–144)
Total Protein: 6.9 g/dL (ref 6.0–8.5)

## 2016-02-02 LAB — SPECIMEN STATUS REPORT

## 2016-02-11 ENCOUNTER — Encounter: Payer: Self-pay | Admitting: *Deleted

## 2016-06-09 ENCOUNTER — Encounter: Payer: Self-pay | Admitting: Urgent Care

## 2016-06-09 ENCOUNTER — Ambulatory Visit (INDEPENDENT_AMBULATORY_CARE_PROVIDER_SITE_OTHER): Payer: Self-pay | Admitting: Urgent Care

## 2016-06-09 VITALS — BP 147/93 | HR 76 | Temp 98.5°F | Resp 16 | Wt 180.6 lb

## 2016-06-09 DIAGNOSIS — E860 Dehydration: Secondary | ICD-10-CM

## 2016-06-09 DIAGNOSIS — R319 Hematuria, unspecified: Secondary | ICD-10-CM

## 2016-06-09 DIAGNOSIS — R110 Nausea: Secondary | ICD-10-CM

## 2016-06-09 DIAGNOSIS — R1084 Generalized abdominal pain: Secondary | ICD-10-CM

## 2016-06-09 DIAGNOSIS — R197 Diarrhea, unspecified: Secondary | ICD-10-CM

## 2016-06-09 LAB — POCT CBC
Granulocyte percent: 74.2 %G (ref 37–80)
HCT, POC: 49.4 % (ref 43.5–53.7)
HEMOGLOBIN: 17.4 g/dL (ref 14.1–18.1)
LYMPH, POC: 1.2 (ref 0.6–3.4)
MCH, POC: 33.2 pg — AB (ref 27–31.2)
MCHC: 35.1 g/dL (ref 31.8–35.4)
MCV: 94.6 fL (ref 80–97)
MID (cbc): 0.4 (ref 0–0.9)
MPV: 9.3 fL (ref 0–99.8)
PLATELET COUNT, POC: 129 10*3/uL — AB (ref 142–424)
POC Granulocyte: 4.5 (ref 2–6.9)
POC LYMPH %: 19.6 % (ref 10–50)
POC MID %: 6.2 %M (ref 0–12)
RBC: 5.22 M/uL (ref 4.69–6.13)
RDW, POC: 13.4 %
WBC: 6 10*3/uL (ref 4.6–10.2)

## 2016-06-09 LAB — POCT URINALYSIS DIP (MANUAL ENTRY)
Bilirubin, UA: NEGATIVE
Glucose, UA: NEGATIVE mg/dL
LEUKOCYTES UA: NEGATIVE
Nitrite, UA: NEGATIVE
PH UA: 5.5 (ref 5.0–8.0)
PROTEIN UA: NEGATIVE mg/dL
SPEC GRAV UA: 1.025 (ref 1.010–1.025)
UROBILINOGEN UA: 0.2 U/dL

## 2016-06-09 MED ORDER — ONDANSETRON 8 MG PO TBDP: 8.0000 mg | ORAL_TABLET | Freq: Three times a day (TID) | ORAL | 0 refills | Status: DC | PRN

## 2016-06-09 NOTE — Progress Notes (Signed)
MRN: [id number] DOB: 01/21/1971  Subjective:   Caleb Pace is a 46 y.o. male presenting for chief complaint of Abdominal Pain (upper left x 3 days, diarrhea and nausea)  Reports 3 day history of generalized abdominal pain, diarrhea, fever (102F), nausea without vomiting. Also reports slight dizziness and headache since he became ill. Has also had dry mouth, polyuria prior to his illness. Has not tried any medications for relief. Has been hydrating aggressively, drinking Pedialyte. Denies bloody stools, chest pain, shob, heart racing, rashes. Denies eating unfiltered water and uncooked meat but does eat eggs regularly and does not always have them cooked all the way through. Denies smoking cigarettes. Hydrates with 3-4 bottles of water. Eats mostly [place] food, does not try to eat healthily.   Caleb Pace is not currently taking any medications. Also is allergic to asa [aspirin] and lactose intolerance (gi). Caleb Pace  has a past medical history of GERD (gastroesophageal reflux disease) and Helicobacter pylori gastritis. Also  has a past surgical history that includes Esophagogastroduodenoscopy (N/A, 12/22/2012).  Objective:   Vitals: BP (!) 144/91   Pulse 76   Temp 98.5 F (36.9 C)   Resp 16   Wt 180 lb 9.6 oz (81.9 kg)   SpO2 97%   BMI 28.29 kg/m   Physical Exam  Constitutional: He is oriented to person, place, and time. He appears well-developed and well-nourished.  HENT:  Mouth/Throat: Oropharynx is clear and moist.  Cardiovascular: Normal rate, regular rhythm and intact distal pulses.  Exam reveals no gallop and no friction rub.   No murmur heard. Pulmonary/Chest: No respiratory distress. He has no wheezes. He has no rales.  Abdominal: Soft. Bowel sounds are normal. He exhibits no distension and no mass. There is tenderness (left-sided, LLQ>LUQ). There is no guarding.  Neurological: He is alert and oriented to person, place, and time.  Skin: Skin is warm and dry. Capillary  refill takes less than 2 seconds.  Psychiatric: He has a normal mood and affect.   Results for orders placed or performed in visit on 06/09/16 (from the past 24 hour(s))  POCT CBC     Status: Abnormal   Collection Time: 06/09/16  1:36 PM  Result Value Ref Range   WBC 6.0 4.6 - 10.2 K/uL   Lymph, poc 1.2 0.6 - 3.4   POC LYMPH PERCENT 19.6 10 - 50 %L   MID (cbc) 0.4 0 - 0.9   POC MID % 6.2 0 - 12 %M   POC Granulocyte 4.5 2 - 6.9   Granulocyte percent 74.2 37 - 80 %G   RBC 5.22 4.69 - 6.13 M/uL   Hemoglobin 17.4 14.1 - 18.1 g/dL   HCT, POC 82.949.4 56.243.5 - 53.7 %   MCV 94.6 80 - 97 fL   MCH, POC 33.2 (A) 27 - 31.2 pg   MCHC 35.1 31.8 - 35.4 g/dL   RDW, POC 13.013.4 %   Platelet Count, POC 129 (A) 142 - 424 K/uL   MPV 9.3 0 - 99.8 fL  POCT urinalysis dipstick     Status: Abnormal   Collection Time: 06/09/16  1:37 PM  Result Value Ref Range   Color, UA yellow yellow   Clarity, UA clear clear   Glucose, UA negative negative mg/dL   Bilirubin, UA negative negative   Ketones, POC UA trace (5) (A) negative mg/dL   Spec Grav, UA 8.6571.025 8.4691.010 - 1.025   Blood, UA moderate (A) negative   pH, UA 5.5 5.0 - 8.0  Protein Ur, POC negative negative mg/dL   Urobilinogen, UA 0.2 0.2 or 1.0 E.U./dL   Nitrite, UA Negative Negative   Leukocytes, UA Negative Negative   Assessment and Plan :   1. Generalized abdominal pain 2. Diarrhea, unspecified type 3. Nausea without vomiting 4. Hematuria, unspecified type 5. Dehydration - Will manage as a viral gastroenteritis. Use supportive care, will f/u with labs.  Wallis Bamberg, PA-C [facility] at [facility] [phone] 06/09/2016  12:46 PM

## 2016-06-09 NOTE — Patient Instructions (Addendum)
Gastroenteritis viral en los adultos (Viral Gastroenteritis, Adult) La gastroenteritis viral tambin se conoce como gripe estomacal. La causa de esta afeccin son diversos virus. Estos virus puede transmitirse de una persona a otra con mucha facilidad (son sumamente contagiosos). Esta afeccin podra afectar el estmago, el intestino delgado y el intestino grueso. Puede causar diarrea lquida, fiebre y vmitos repentinos. La diarrea y los vmitos pueden hacerlo sentir dbil y causar deshidratacin. Es posible que no pueda retener los lquidos. La deshidratacin puede hacerlo sentir cansado y sediento, producirle sequedad en la boca y disminuir la frecuencia con la que orina. Los adultos mayores y las personas que tienen otras enfermedades o un sistema inmunitario dbil estn en mayor riesgo de deshidratacin. Es importante restituir los lquidos que pierde por causa de la diarrea y los vmitos. Si se deshidrata gravemente, podra tener que recibir lquidos a travs de una va intravenosa(IV). CAUSAS La gastroenteritis es causada por diversos virus, entre los que se incluyen el rotavirus y el norovirus. El norovirus es la causa ms frecuente en los adultos. Puede contraerlo a travs de la ingesta de alimentos o agua contaminados, o por tocar superficies contaminadas con alguno de estos virus. Tambin puede enfermarse al compartir utensilios u otros artculos personales con una persona infectada. FACTORES DE RIESGO Es ms probable que esta afeccin se manifieste en las personas que:  Tiene un sistema de defensa (sistema inmunitario) dbil.  Viven con uno o ms nios menores de 2aos.  Viven en hogares de ancianos.  Viajan en cruceros. SNTOMAS Los sntomas de esta afeccin suelen aparecer entre 1 y 2das despus de la exposicin al virus. Pueden durar varios das o incluso una semana. Los sntomas ms frecuentes son diarrea lquida y vmitos. Otros sntomas pueden ser los  siguientes:  Fiebre.  Dolor de cabeza.  Fatiga.  Dolor en el abdomen.  Escalofros.  Debilidad.  Nuseas.  Dolores musculares.  Prdida del apetito. DIAGNSTICO Esta afeccin se diagnostica mediante sus antecedentes mdicos y un examen fsico. Tambin podran hacerle un anlisis de materia fecal para detectar virus u otras infecciones. TRATAMIENTO Por lo general, esta afeccin desaparece por s sola. El tratamiento se centra en la restitucin de los lquidos perdidos (rehidratacin). El mdico podra recomendarle que tome una solucin de rehidratacin oral (SRO) para reemplazar sales y minerales (electrolitos) importantes en el cuerpo. En los casos ms graves, podra ser necesario administrar lquidos a travs de una va intravenosa (VI). El tratamiento tambin podra incluir medicamentos para aliviar los sntomas. INSTRUCCIONES PARA EL CUIDADO EN EL HOGAR Siga las indicaciones del mdico sobre cmo debe cuidarse en su casa. Comida y bebida Siga estas recomendaciones como se lo haya indicado el mdico:  Tome una SRO. Esta es una bebida que se vende en farmacias y tiendas.  Beba lquidos claros en pequeas cantidades segn le sea posible. Beba lquidos claros, como agua, cubitos de hielo, jugos de fruta rebajados con agua y bebidas deportivas bajas en caloras.  En la medida en que pueda, consuma alimentos blandos y fciles de digerir en pequeas cantidades. Estos alimentos incluyen bananas, compota de manzana, arroz, carnes magras, tostadas y galletas.  Evite consumir lquidos que contengan mucha azcar o cafena, como bebidas energticas, bebidas deportivas y refrescos.  Evite el alcohol.  Evite los alimentos picantes o grasos. Instrucciones generales  Beba suficiente lquido para mantener la orina clara o de color amarillo plido.  Lvese las manos con frecuencia. Use desinfectante para manos si no dispone de agua y jabn.  Asegrese de que   todas las personas que viven  en su casa se laven bien las manos y con frecuencia.  Tome los medicamentos de venta libre y los recetados solamente como se lo haya indicado el mdico.  Descanse en su casa mientras se recupera.  Controle su afeccin para ver si hay cambios.  Tome un bao caliente para ayudar a disminuir el ardor o el dolor causados por los episodios frecuentes de diarrea.  Concurra a todas las visitas de control como se lo haya indicado el mdico. Esto es importante. SOLICITE ATENCIN MDICA SI:  No puede retener los lquidos.  Los sntomas empeoran.  Aparecen nuevos sntomas.  Se siente mareado o siente que va a desvanecerse.  Tiene calambres musculares.  SOLICITE ATENCIN MDICA DE INMEDIATO SI:  Siente dolor en el pecho.  Se siente muy dbil o se desmaya.  Observa sangre en el vmito.  El vmito se asemeja al poso del caf.  Tiene heces con sangre, de color negro, o con aspecto alquitranado.  Siente un dolor de cabeza intenso, rigidez en el cuello, o ambas cosas.  Tiene una erupcin cutnea.  Tiene dolor intenso, clicos, o meteorismo en el abdomen.  Le cuesta respirar o respira muy rpidamente.  Su corazn late muy rpidamente.  Siente la piel fra y hmeda.  Se siente confundido.  Siente dolor al orinar.  Tiene signos de deshidratacin, por ejemplo: ? Orina oscura, muy escasa o falta de orina. ? Labios agrietados. ? Boca seca. ? Ojos hundidos. ? Somnolencia. ? Debilidad.  Esta informacin no tiene como fin reemplazar el consejo del mdico. Asegrese de hacerle al mdico cualquier pregunta que tenga. Document Released: 01/13/2005 Document Revised: 05/07/2015 Document Reviewed: 09/19/2014 Elsevier Interactive Patient Education  2017 Elsevier Inc.     IF you received an x-ray today, you will receive an invoice from [facility]. Please contact [facility] at [phone] with questions or concerns regarding your invoice.   IF you received  labwork today, you will receive an invoice from [facility]. Please contact [facility] at [phone] with questions or concerns regarding your invoice.   Our billing staff will not be able to assist you with questions regarding bills from these companies.  You will be contacted with the lab results as soon as they are available. The fastest way to get your results is to activate your My Chart account. Instructions are located on the last page of this paperwork. If you have not heard from us regarding the results in 2 weeks, please contact this office.      

## 2016-06-10 LAB — COMPREHENSIVE METABOLIC PANEL
ALK PHOS: 74 IU/L (ref 39–117)
ALT: 26 IU/L (ref 0–44)
AST: 20 IU/L (ref 0–40)
Albumin/Globulin Ratio: 1.4 (ref 1.2–2.2)
Albumin: 4.2 g/dL (ref 3.5–5.5)
BUN / CREAT RATIO: 13 (ref 9–20)
BUN: 8 mg/dL (ref 6–24)
Bilirubin Total: 0.7 mg/dL (ref 0.0–1.2)
CO2: 20 mmol/L (ref 18–29)
Calcium: 9.2 mg/dL (ref 8.7–10.2)
Chloride: 100 mmol/L (ref 96–106)
Creatinine, Ser: 0.62 mg/dL — ABNORMAL LOW (ref 0.76–1.27)
GFR, EST AFRICAN AMERICAN: 138 mL/min/{1.73_m2} (ref >59)
GFR, EST NON AFRICAN AMERICAN: 120 mL/min/{1.73_m2} (ref >59)
GLOBULIN, TOTAL: 2.9 g/dL (ref 1.5–4.5)
GLUCOSE: 102 mg/dL — AB (ref 65–99)
POTASSIUM: 4.2 mmol/L (ref 3.5–5.2)
Sodium: 139 mmol/L (ref 134–144)
Total Protein: 7.1 g/dL (ref 6.0–8.5)

## 2016-06-10 LAB — URINALYSIS, MICROSCOPIC ONLY: CASTS: NONE SEEN /LPF

## 2016-06-10 LAB — HEMOGLOBIN A1C
Est. average glucose Bld gHb Est-mCnc: 108 mg/dL
Hgb A1c MFr Bld: 5.4 % (ref 4.8–5.6)

## 2016-06-12 ENCOUNTER — Emergency Department (HOSPITAL_COMMUNITY)
Admission: EM | Admit: 2016-06-12 | Discharge: 2016-06-12 | Disposition: A | Discharge status: Home / Self Care | Payer: Self-pay | Attending: Emergency Medicine | Admitting: Emergency Medicine

## 2016-06-12 ENCOUNTER — Encounter (HOSPITAL_COMMUNITY): Payer: Self-pay | Admitting: Emergency Medicine

## 2016-06-12 DIAGNOSIS — Z79899 Other long term (current) drug therapy: Secondary | ICD-10-CM | POA: Insufficient documentation

## 2016-06-12 DIAGNOSIS — Z87891 Personal history of nicotine dependence: Secondary | ICD-10-CM | POA: Insufficient documentation

## 2016-06-12 DIAGNOSIS — R519 Headache, unspecified: Secondary | ICD-10-CM

## 2016-06-12 DIAGNOSIS — R197 Diarrhea, unspecified: Secondary | ICD-10-CM | POA: Insufficient documentation

## 2016-06-12 DIAGNOSIS — I10 Essential (primary) hypertension: Secondary | ICD-10-CM | POA: Insufficient documentation

## 2016-06-12 DIAGNOSIS — R51 Headache: Secondary | ICD-10-CM | POA: Insufficient documentation

## 2016-06-12 HISTORY — DX: Essential (primary) hypertension: I10

## 2016-06-12 LAB — URINALYSIS, ROUTINE W REFLEX MICROSCOPIC
BACTERIA UA: NONE SEEN
BILIRUBIN URINE: NEGATIVE
Glucose, UA: NEGATIVE mg/dL
Ketones, ur: NEGATIVE mg/dL
LEUKOCYTES UA: NEGATIVE
NITRITE: NEGATIVE
PROTEIN: NEGATIVE mg/dL
SPECIFIC GRAVITY, URINE: 1.016 (ref 1.005–1.030)
SQUAMOUS EPITHELIAL / LPF: NONE SEEN
pH: 5 (ref 5.0–8.0)

## 2016-06-12 LAB — CBC
HEMATOCRIT: 47.2 % (ref 39.0–52.0)
HEMOGLOBIN: 16.1 g/dL (ref 13.0–17.0)
MCH: 31.7 pg (ref 26.0–34.0)
MCHC: 34.1 g/dL (ref 30.0–36.0)
MCV: 92.9 fL (ref 78.0–100.0)
Platelets: 169 10*3/uL (ref 150–400)
RBC: 5.08 MIL/uL (ref 4.22–5.81)
RDW: 12.6 % (ref 11.5–15.5)
WBC: 5.2 10*3/uL (ref 4.0–10.5)

## 2016-06-12 LAB — COMPREHENSIVE METABOLIC PANEL
ALK PHOS: 72 U/L (ref 38–126)
ALT: 42 U/L (ref 17–63)
ANION GAP: 8 (ref 5–15)
AST: 38 U/L (ref 15–41)
Albumin: 3.9 g/dL (ref 3.5–5.0)
BUN: 10 mg/dL (ref 6–20)
CALCIUM: 9 mg/dL (ref 8.9–10.3)
CO2: 19 mmol/L — ABNORMAL LOW (ref 22–32)
CREATININE: 0.55 mg/dL — AB (ref 0.61–1.24)
Chloride: 108 mmol/L (ref 101–111)
Glucose, Bld: 99 mg/dL (ref 65–99)
Potassium: 3.5 mmol/L (ref 3.5–5.1)
Sodium: 135 mmol/L (ref 135–145)
TOTAL PROTEIN: 6.9 g/dL (ref 6.5–8.1)
Total Bilirubin: 0.7 mg/dL (ref 0.3–1.2)

## 2016-06-12 LAB — LIPASE, BLOOD: LIPASE: 27 U/L (ref 11–51)

## 2016-06-12 LAB — CK: CK TOTAL: 115 U/L (ref 49–397)

## 2016-06-12 MED ORDER — SODIUM CHLORIDE 0.9 % IV BOLUS (SEPSIS)
1000.0000 mL | Freq: Once | INTRAVENOUS | Status: AC
  Administered 2016-06-12: 1000 mL via INTRAVENOUS

## 2016-06-12 MED ORDER — ONDANSETRON HCL 4 MG/2ML IJ SOLN
4.0000 mg | Freq: Once | INTRAMUSCULAR | Status: AC
  Administered 2016-06-12: 4 mg via INTRAVENOUS
  Filled 2016-06-12: qty 2

## 2016-06-12 MED ORDER — MORPHINE SULFATE (PF) 4 MG/ML IV SOLN
4.0000 mg | Freq: Once | INTRAVENOUS | Status: AC
  Administered 2016-06-12: 4 mg via INTRAVENOUS
  Filled 2016-06-12: qty 1

## 2016-06-12 MED ORDER — LABETALOL HCL 5 MG/ML IV SOLN
20.0000 mg | Freq: Once | INTRAVENOUS | Status: AC
  Administered 2016-06-12: 20 mg via INTRAVENOUS
  Filled 2016-06-12: qty 4

## 2016-06-12 NOTE — ED Triage Notes (Signed)
Pt reports that he has had diarrhea for a week along w/ a headache.  He took tylenol but it only worked for a short while.

## 2016-06-12 NOTE — ED Provider Notes (Signed)
[facility]-EMERGENCY DEPT Provider Note   CSN: [id number] Arrival date & time: 06/12/16  0034     History   Chief Complaint Chief Complaint  Patient presents with  . Headache  . Diarrhea    HPI Caleb Pace is a 46 y.o. male.  Patient presents to the ED with a chief complaint of headache and diarrhea.  He states that he began having diarrhea about a week ago.  He attributes the start of his symptoms to having eaten runny eggs.  He was seen at urgent care a few days ago and had negative stool test for salmonella.  He reports that he is also starting to have a headache.  He reports that his BP has been elevated.  He reports intermittent abdominal cramps.  He states that he was originally running a fever several days ago, but hasn't recently.   The history is provided by the patient. No language interpreter was used.    Past Medical History:  Diagnosis Date  . GERD (gastroesophageal reflux disease)   . Helicobacter pylori gastritis   . Hypertension     Patient Active Problem List   Diagnosis Date Noted  . Psoriasis 01/30/2016  . Other dysphagia 12/22/2012  . GERD (gastroesophageal reflux disease) 04/06/2012  . History of Helicobacter pylori infection 03/16/2012  . Dyspepsia 03/16/2012    Past Surgical History:  Procedure Laterality Date  . ESOPHAGOGASTRODUODENOSCOPY N/A 12/22/2012   Procedure: ESOPHAGOGASTRODUODENOSCOPY (EGD);  Surgeon: Meryl DareMalcolm T Stark, MD;  [facility];  Service: Endoscopy;  Laterality: N/A;       Home Medications    Prior to Admission medications   Medication Sig Start Date End Date Taking? Authorizing Provider  ondansetron (ZOFRAN-ODT) 8 MG disintegrating tablet Take 1 tablet (8 mg total) by mouth every 8 (eight) hours as needed for nausea. 06/09/16   Wallis BambergMani, Mario, PA-C    Family History No family history on file.  Social History Social History  Substance Use Topics  . Smoking status: Former Smoker    Packs/day: 0.25    Years:  10.00    Types: Cigarettes  . Smokeless tobacco: Never Used  . Alcohol use 0.0 oz/week    5 - 6 Cans of beer per week     Allergies   Asa [aspirin] and Lactose intolerance (gi)   Review of Systems Review of Systems  All other systems reviewed and are negative.    Physical Exam Updated Vital Signs BP (!) 146/102 (BP Location: Right Arm)   Pulse 72   Temp 98.1 F (36.7 C) (Oral)   Resp 19   SpO2 97%   Physical Exam  Constitutional: He is oriented to person, place, and time. He appears well-developed and well-nourished.  HENT:  Head: Normocephalic and atraumatic.  Eyes: Conjunctivae and EOM are normal. Pupils are equal, round, and reactive to light. Right eye exhibits no discharge. Left eye exhibits no discharge. No scleral icterus.  Neck: Normal range of motion. Neck supple. No JVD present.  Cardiovascular: Normal rate, regular rhythm and normal heart sounds.  Exam reveals no gallop and no friction rub.   No murmur heard. Pulmonary/Chest: Effort normal and breath sounds normal. No respiratory distress. He has no wheezes. He has no rales. He exhibits no tenderness.  Abdominal: Soft. He exhibits no distension and no mass. There is no tenderness. There is no rebound and no guarding.  No focal abdominal tenderness, no RLQ tenderness or pain at McBurney's point, no RUQ tenderness or Murphy's sign, no left-sided abdominal  tenderness, no fluid wave, or signs of peritonitis   Musculoskeletal: Normal range of motion. He exhibits no edema or tenderness.  Neurological: He is alert and oriented to person, place, and time.  Skin: Skin is warm and dry.  Psychiatric: He has a normal mood and affect. His behavior is normal. Judgment and thought content normal.  Nursing note and vitals reviewed.    ED Treatments / Results  Labs (all labs ordered are listed, but only abnormal results are displayed) Labs Reviewed  COMPREHENSIVE METABOLIC PANEL - Abnormal; Notable for the following:        Result Value   CO2 19 (*)    Creatinine, Ser 0.55 (*)    All other components within normal limits  URINALYSIS, ROUTINE W REFLEX MICROSCOPIC - Abnormal; Notable for the following:    Hgb urine dipstick SMALL (*)    All other components within normal limits  LIPASE, BLOOD  CBC  CK    EKG  EKG Interpretation None       Radiology No results found.  Procedures Procedures (including critical care time)  Medications Ordered in ED Medications  sodium chloride 0.9 % bolus 1,000 mL (not administered)  morphine 4 MG/ML injection 4 mg (not administered)  ondansetron (ZOFRAN) injection 4 mg (not administered)     Initial Impression / Assessment and Plan / ED Course  I have reviewed the triage vital signs and the nursing notes.  Pertinent labs & imaging results that were available during my care of the patient were reviewed by me and considered in my medical decision making (see chart for details).     Patient with diarrhea and headache.  I suspect that he is dehydrated and this may be contributing to his headache.  Will give fluids, check labs, and will reassess.  Laboratory workup is reassuring. I was also able to review his recent stool pathogen panel ordered by urgent care which was negative. Recommend continuing fluids. Recommend follow-up with primary care in one week. His headache has resolved. His laboratory workup is reassuring. He will also need to follow-up regarding his elevated blood pressure. Patient understands agrees the plan. He is stable and ready for discharge.  Final Clinical Impressions(s) / ED Diagnoses   Final diagnoses:  Diarrhea, unspecified type  Acute nonintractable headache, unspecified headache type    New Prescriptions New Prescriptions   No medications on file     Roxy Horseman, Cordelia Poche 06/12/16 [id number]    Devoria Albe, MD 06/12/16 [id number]

## 2016-06-13 LAB — STOOL CULTURE: E coli, Shiga toxin Assay: NEGATIVE

## 2016-06-19 ENCOUNTER — Encounter: Payer: Self-pay | Admitting: Emergency Medicine

## 2016-06-19 ENCOUNTER — Ambulatory Visit (INDEPENDENT_AMBULATORY_CARE_PROVIDER_SITE_OTHER): Payer: Self-pay | Admitting: Emergency Medicine

## 2016-06-19 VITALS — BP 158/89 | HR 96 | Temp 97.6°F | Resp 16 | Wt 178.6 lb

## 2016-06-19 DIAGNOSIS — Z8619 Personal history of other infectious and parasitic diseases: Secondary | ICD-10-CM

## 2016-06-19 DIAGNOSIS — I10 Essential (primary) hypertension: Secondary | ICD-10-CM | POA: Insufficient documentation

## 2016-06-19 MED ORDER — LISINOPRIL-HYDROCHLOROTHIAZIDE 10-12.5 MG PO TABS: 1.0000 | ORAL_TABLET | Freq: Every day | ORAL | 3 refills | Status: DC

## 2016-06-19 NOTE — Progress Notes (Signed)
Caleb Pace 46 y.o.   Chief Complaint  Patient presents with  . Hypertension    dizzy  . Sinus Problem    eye itch    HISTORY OF PRESENT ILLNESS: This is a 46 y.o. male complaining of high BP; has h/o chronic dizziness; active lifestyle; drinks beer daily; bad sleeping habits; eats well; BP has been 150-160's/100-110's. On no medication; seen recently in ER with diarrhea; had blood work that showed normal renal function, normal electrolytes and normal CBC. HPI   Prior to Admission medications   Not on File    Allergies  Allergen Reactions  . Asa [Aspirin]     Upset stomach   . Lactose Intolerance (Gi)     Upset stomach     Patient Active Problem List   Diagnosis Date Noted  . Psoriasis 01/30/2016  . Other dysphagia 12/22/2012  . GERD (gastroesophageal reflux disease) 04/06/2012  . History of Helicobacter pylori infection 03/16/2012  . Dyspepsia 03/16/2012    Past Medical History:  Diagnosis Date  . GERD (gastroesophageal reflux disease)   . Helicobacter pylori gastritis   . Hypertension     Past Surgical History:  Procedure Laterality Date  . ESOPHAGOGASTRODUODENOSCOPY N/A 12/22/2012   Procedure: ESOPHAGOGASTRODUODENOSCOPY (EGD);  Surgeon: Meryl Dare, MD;  Location: [facility];  Service: Endoscopy;  Laterality: N/A;    Social History   Social History  . Marital status: Married    Spouse name: N/A  . Number of children: 0  . Years of education: N/A   Occupational History  . PAINTER    Social History Main Topics  . Smoking status: Former Smoker    Packs/day: 0.25    Years: 10.00    Types: Cigarettes  . Smokeless tobacco: Never Used  . Alcohol use 0.0 oz/week    5 - 6 Cans of beer per week  . Drug use: No  . Sexual activity: Not on file   Other Topics Concern  . Not on file   Social History Narrative  . No narrative on file    No family history on file.   Review of Systems  Constitutional: Negative.  Negative for chills,  fever and malaise/fatigue.  HENT: Negative.  Negative for congestion, ear pain, nosebleeds and sore throat.   Eyes: Negative for blurred vision and double vision.  Respiratory: Negative for cough and shortness of breath.   Cardiovascular: Negative for chest pain, palpitations and leg swelling.  Gastrointestinal: Negative for abdominal pain, diarrhea, nausea and vomiting.  Genitourinary: Negative for dysuria and hematuria.  Musculoskeletal: Negative for back pain, myalgias and neck pain.  Skin: Negative for rash.  Neurological: Positive for dizziness (chronic). Negative for headaches.  Endo/Heme/Allergies: Negative.   Psychiatric/Behavioral: Negative for depression. The patient is not nervous/anxious.   All other systems reviewed and are negative.  The 10-year ASCVD risk score Denman George DC Montez Hageman., et al., 2013) is: 3%   Values used to calculate the score:     Age: 32 years     Sex: Male     Is Non-Hispanic African American: No     Diabetic: No     Tobacco smoker: No     Systolic Blood Pressure: 158 mmHg     Is BP treated: No     HDL Cholesterol: 49 mg/dL     Total Cholesterol: 198 mg/dL  Vitals:   16/10/96 0454  BP: (!) 158/89  Pulse: 96  Resp: 16  Temp: 97.6 F (36.4 C)    Physical Exam  Constitutional: He is oriented to person, place, and time. He appears well-developed and well-nourished.  HENT:  Head: Normocephalic and atraumatic.  Mouth/Throat: Oropharynx is clear and moist. No oropharyngeal exudate.  Eyes: Conjunctivae and EOM are normal. Pupils are equal, round, and reactive to light.  Neck: Normal range of motion. Neck supple. No JVD present. Carotid bruit is not present. No thyromegaly present.  Cardiovascular: Normal rate, regular rhythm, normal heart sounds and intact distal pulses.   Pulmonary/Chest: Effort normal and breath sounds normal.  Abdominal: Soft. Bowel sounds are normal. He exhibits no distension. There is no tenderness.  Musculoskeletal: Normal range of  motion.  Lymphadenopathy:    He has no cervical adenopathy.  Neurological: He is alert and oriented to person, place, and time. No sensory deficit. He exhibits normal muscle tone. Coordination normal.  Skin: Skin is warm and dry. Capillary refill takes less than 2 seconds. No rash noted.  Psychiatric: He has a normal mood and affect. His behavior is normal.  Vitals reviewed.  Last EKG on record 01/29/16 reviewed.  ASSESSMENT & PLAN: Caleb Pace was seen today for hypertension and sinus problem.  Diagnoses and all orders for this visit:  Essential hypertension  History of Helicobacter pylori infection -     H. pylori breath test  Other orders -     lisinopril-hydrochlorothiazide (PRINZIDE,ZESTORETIC) 10-12.5 MG tablet; Take 1 tablet by mouth daily.    Patient Instructions       IF you received an x-ray today, you will receive an invoice from [facility]. Please contact [facility] at [phone] with questions or concerns regarding your invoice.   IF you received labwork today, you will receive an invoice from [facility]. Please contact [facility] at [phone] with questions or concerns regarding your invoice.   Our billing staff will not be able to assist you with questions regarding bills from these companies.  You will be contacted with the lab results as soon as they are available. The fastest way to get your results is to activate your My Chart account. Instructions are located on the last page of this paperwork. If you have not heard from us regarding the results in 2 weeks, please contact this office.     Hipertensin Hypertension El trmino hipertensin es otra forma de denominar a la presin arterial elevada. La presin arterial elevada fuerza al corazn a trabajar ms para bombear la sangre. Esto puede causar problemas con el paso del [place]. Una lectura de presin arterial est compuesta por 2 nmeros. Hay un nmero superior (sistlico) sobre un  nmero inferior (diastlico). Lo ideal es tener la presin arterial por debajo de 120/80. Las decisiones saludables pueden ayudarle a disminuir su presin arterial. Es posible que necesite medicamentos que le ayuden a disminuir su presin arterial si:  Su presin arterial no disminuye mediante decisiones saludables.  Su presin arterial est por encima de 130/80. Siga estas instrucciones en su casa: Comida y bebida   Si se lo indican, siga el plan de alimentacin de DASH (Dietary Approaches to Stop Hypertension, Maneras de alimentarse para detener la hipertensin). Esta dieta incluye:  Que la mitad del plato de cada comida sea de frutas y verduras.  Que un cuarto del plato de cada comida sea de cereales integrales. Los cereales integrales incluyen pasta integral, arroz integral y pan integral.  Comer y beber productos lcteos con bajo contenido de Faygrasa, como leche descremada o yogur bajo en grasas.  Que un cuarto del plato de cada comida sea de protenas  bajas en grasa Nonah Mattes). Las protenas bajas en grasa incluyen pescado, pollo sin piel, huevos, frijoles y tofu.  Evitar consumir carne grasa, carne curada y procesada, o pollo con piel.  Evitar consumir alimentos prehechos o procesados.  Consuma menos de 1500 mg de sal (sodio) por da.  Limite el consumo de alcohol a no ms de 1 medida por da si es mujer y no est Orthoptist y a 2 medidas por da si es hombre. Una medida equivale a 12onzas de cerveza, 5onzas de vino o 1onzas de bebidas alcohlicas de alta graduacin. Estilo de vida   Trabaje con su mdico para mantenerse en un peso saludable o para perder peso. Pregntele a su mdico cul es el peso recomendable para usted.  Realice al menos 30 minutos de ejercicio que haga que se acelere su corazn (ejercicio Magazine features editor) la [employer] de la [place]. Estos pueden incluir caminar, nadar o andar en bicicleta.  Realice al menos 30 minutos de ejercicio que fortalezca sus  msculos (ejercicios de resistencia) al menos 3 das a la [place]. Estos pueden incluir levantar pesas o hacer pilates.  No consuma ningn producto que contenga nicotina o tabaco. Esto incluye cigarrillos y cigarrillos electrnicos. Si necesita ayuda para dejar de fumar, consulte al [employer].  Controle su presin arterial en su casa tal como le indic el mdico.  Concurra a todas las visitas de control como se lo haya indicado el mdico. Esto es importante. Medicamentos   [employer] de venta libre y los recetados solamente como se lo haya indicado el mdico. Siga cuidadosamente las indicaciones.  No omita las dosis de medicamentos para la presin arterial. Los medicamentos pierden eficacia si omite dosis. El hecho de omitir las dosis tambin [place] el riesgo de otros problemas.  Pregntele a su mdico a qu efectos secundarios o reacciones a los Museum/gallery curator. Comunquese con un mdico si:  Piensa que tiene [place] reaccin a los medicamentos que est tomando.  Tiene dolores de cabeza frecuentes (recurrentes).  Siente mareos.  Tiene hinchazn en los tobillos.  Tiene problemas de visin. Solicite ayuda de inmediato si:  Siente un dolor de cabeza muy intenso.  Comienza a sentirse confundido.  Se siente dbil o adormecido.  Siente que va a desmayarse.  Siente un dolor muy intenso en:  El pecho.  El vientre (abdomen).  Devuelve (vomita) ms de una vez.  Tiene dificultad para respirar. Resumen  El trmino hipertensin es otra forma de denominar a la presin arterial elevada.  Las decisiones saludables pueden ayudarle a disminuir su presin arterial. Si no puede controlar su presin arterial mediante decisiones saludables, es posible que deba tomar medicamentos. Esta informacin no tiene Theme park manager el consejo del mdico. Asegrese de hacerle al mdico cualquier pregunta que tenga. Document Released: 07/03/2009 Document Revised: 12/26/2015  Document Reviewed: 12/26/2015 Elsevier Interactive Patient Education  2017 Elsevier Inc.      Edwina Barth, MD [facility]

## 2016-06-19 NOTE — Patient Instructions (Addendum)
IF you received an x-ray today, you will receive an invoice from [facility]. Please contact [facility] at [phone] with questions or concerns regarding your invoice.   IF you received labwork today, you will receive an invoice from [facility]. Please contact [facility] at [phone] with questions or concerns regarding your invoice.   Our billing staff will not be able to assist you with questions regarding bills from these companies.  You will be contacted with the lab results as soon as they are available. The fastest way to get your results is to activate your My Chart account. Instructions are located on the last page of this paperwork. If you have not heard from us regarding the results in 2 weeks, please contact this office.     Hipertensin Hypertension El trmino hipertensin es otra forma de denominar a la presin arterial elevada. La presin arterial elevada fuerza al corazn a trabajar ms para bombear la sangre. Esto puede causar problemas con el paso del [place]iempo. Una lectura de presin arterial est compuesta por 2 nmeros. Hay un nmero superior (sistlico) sobre un nmero inferior (diastlico). Lo ideal es tener la presin arterial por debajo de 120/80. Las decisiones saludables pueden ayudarle a disminuir su presin arterial. Es posible que necesite medicamentos que le ayuden a disminuir su presin arterial si:  Su presin arterial no disminuye mediante decisiones saludables.  Su presin arterial est por encima de 130/80. Siga estas instrucciones en su casa: Comida y bebida   Si se lo indican, siga el plan de alimentacin de DASH (Dietary Approaches to Stop Hypertension, Maneras de alimentarse para detener la hipertensin). Esta dieta incluye:  Que la mitad del plato de cada comida sea de frutas y verduras.  Que un cuarto del plato de cada comida sea de cereales integrales. Los cereales integrales incluyen pasta integral, arroz integral y pan  integral.  Comer y beber productos lcteos con bajo contenido de Clarksongrasa, como leche descremada o yogur bajo en grasas.  Que un cuarto del plato de cada comida sea de protenas bajas en grasa [place](magras). Las protenas bajas en grasa incluyen pescado, pollo sin piel, huevos, frijoles y tofu.  Evitar consumir carne grasa, carne curada y procesada, o pollo con piel.  Evitar consumir alimentos prehechos o procesados.  Consuma menos de 1500 mg de sal (sodio) por da.  Limite el consumo de alcohol a no ms de 1 medida por da si es mujer y no est Orthoptistembarazada y a 2 medidas por da si es hombre. Una medida equivale a 12onzas de cerveza, 5onzas de vino o 1onzas de bebidas alcohlicas de alta graduacin. Estilo de vida   Trabaje con su mdico para mantenerse en un peso saludable o para perder peso. Pregntele a su mdico cul es el peso recomendable para usted.  Realice al menos 30 minutos de ejercicio que haga que se acelere su corazn (ejercicio Magazine features editoraerbico) la DIRECTVmayora de los das de la [place]. Estos pueden incluir caminar, nadar o andar en bicicleta.  Realice al menos 30 minutos de ejercicio que fortalezca sus msculos (ejercicios de resistencia) al menos 3 das a la [place]a. Estos pueden incluir levantar pesas o hacer pilates.  No consuma ningn producto que contenga nicotina o tabaco. Esto incluye cigarrillos y cigarrillos electrnicos. Si necesita ayuda para dejar de fumar, consulte al mdico.  Controle su presin arterial en su casa tal como le indic el mdico.  Concurra a todas las visitas de control como se lo haya indicado el mdico. Esto  es importante. Medicamentos   Baxter Internationalome los medicamentos de venta libre y los recetados solamente como se lo haya indicado el mdico. Siga cuidadosamente las indicaciones.  No omita las dosis de medicamentos para la presin arterial. Los medicamentos pierden eficacia si omite dosis. El hecho de omitir las dosis tambin Lesothoaumenta el riesgo de otros  problemas.  Pregntele a su mdico a qu efectos secundarios o reacciones a los Museum/gallery curatormedicamentos debe prestar atencin. Comunquese con un mdico si:  Piensa que tiene [place] reaccin a los medicamentos que est tomando.  Tiene dolores de cabeza frecuentes (recurrentes).  Siente mareos.  Tiene hinchazn en los tobillos.  Tiene problemas de visin. Solicite ayuda de inmediato si:  Siente un dolor de cabeza muy intenso.  Comienza a sentirse confundido.  Se siente dbil o adormecido.  Siente que va a desmayarse.  Siente un dolor muy intenso en:  El pecho.  El vientre (abdomen).  Devuelve (vomita) ms de una vez.  Tiene dificultad para respirar. Resumen  El trmino hipertensin es otra forma de denominar a la presin arterial elevada.  Las decisiones saludables pueden ayudarle a disminuir su presin arterial. Si no puede controlar su presin arterial mediante decisiones saludables, es posible que deba tomar medicamentos. Esta informacin no tiene Theme park managercomo fin reemplazar el consejo del mdico. Asegrese de hacerle al mdico cualquier pregunta que tenga. Document Released: 07/03/2009 Document Revised: 12/26/2015 Document Reviewed: 12/26/2015 Elsevier Interactive Patient Education  2017 [employer]

## 2016-06-20 LAB — H. PYLORI BREATH TEST: H PYLORI BREATH TEST: NEGATIVE

## 2016-06-24 ENCOUNTER — Encounter: Payer: Self-pay | Admitting: Radiology

## 2016-08-16 DIAGNOSIS — J039 Acute tonsillitis, unspecified: Secondary | ICD-10-CM | POA: Insufficient documentation

## 2016-08-16 DIAGNOSIS — M542 Cervicalgia: Secondary | ICD-10-CM | POA: Insufficient documentation

## 2016-08-16 DIAGNOSIS — I10 Essential (primary) hypertension: Secondary | ICD-10-CM | POA: Insufficient documentation

## 2016-08-16 DIAGNOSIS — Z87891 Personal history of nicotine dependence: Secondary | ICD-10-CM | POA: Insufficient documentation

## 2016-08-17 ENCOUNTER — Encounter (HOSPITAL_COMMUNITY): Payer: Self-pay | Admitting: Emergency Medicine

## 2016-08-17 ENCOUNTER — Emergency Department (HOSPITAL_COMMUNITY): Payer: Self-pay

## 2016-08-17 ENCOUNTER — Emergency Department (HOSPITAL_COMMUNITY)
Admission: EM | Admit: 2016-08-17 | Discharge: 2016-08-17 | Disposition: A | Discharge status: Home / Self Care | Payer: Self-pay | Attending: Emergency Medicine | Admitting: Emergency Medicine

## 2016-08-17 DIAGNOSIS — J029 Acute pharyngitis, unspecified: Secondary | ICD-10-CM

## 2016-08-17 DIAGNOSIS — J039 Acute tonsillitis, unspecified: Secondary | ICD-10-CM

## 2016-08-17 LAB — BASIC METABOLIC PANEL
Anion gap: 9 (ref 5–15)
BUN: 9 mg/dL (ref 6–20)
CHLORIDE: 104 mmol/L (ref 101–111)
CO2: 26 mmol/L (ref 22–32)
CREATININE: 0.65 mg/dL (ref 0.61–1.24)
Calcium: 9 mg/dL (ref 8.9–10.3)
GFR calc Af Amer: >60 mL/min (ref >60)
GFR calc non Af Amer: >60 mL/min (ref >60)
GLUCOSE: 93 mg/dL (ref 65–99)
Potassium: 3.4 mmol/L — ABNORMAL LOW (ref 3.5–5.1)
SODIUM: 139 mmol/L (ref 135–145)

## 2016-08-17 LAB — CBC WITH DIFFERENTIAL/PLATELET
Basophils Absolute: 0 10*3/uL (ref 0.0–0.1)
Basophils Relative: 1 %
EOS ABS: 0.3 10*3/uL (ref 0.0–0.7)
EOS PCT: 5 %
HCT: 41.7 % (ref 39.0–52.0)
Hemoglobin: 14.6 g/dL (ref 13.0–17.0)
LYMPHS ABS: 2.2 10*3/uL (ref 0.7–4.0)
LYMPHS PCT: 38 %
MCH: 32.4 pg (ref 26.0–34.0)
MCHC: 35 g/dL (ref 30.0–36.0)
MCV: 92.7 fL (ref 78.0–100.0)
MONO ABS: 0.4 10*3/uL (ref 0.1–1.0)
MONOS PCT: 7 %
Neutro Abs: 2.9 10*3/uL (ref 1.7–7.7)
Neutrophils Relative %: 49 %
PLATELETS: 198 10*3/uL (ref 150–400)
RBC: 4.5 MIL/uL (ref 4.22–5.81)
RDW: 13 % (ref 11.5–15.5)
WBC: 5.8 10*3/uL (ref 4.0–10.5)

## 2016-08-17 MED ORDER — CLINDAMYCIN HCL 300 MG PO CAPS: 300.0000 mg | ORAL_CAPSULE | Freq: Four times a day (QID) | ORAL | 0 refills | Status: DC

## 2016-08-17 MED ORDER — PANTOPRAZOLE SODIUM 40 MG IV SOLR
40.0000 mg | Freq: Once | INTRAVENOUS | Status: AC
  Administered 2016-08-17: 40 mg via INTRAVENOUS
  Filled 2016-08-17: qty 40

## 2016-08-17 MED ORDER — CLINDAMYCIN PHOSPHATE 600 MG/50ML IV SOLN
600.0000 mg | Freq: Once | INTRAVENOUS | Status: AC
  Administered 2016-08-17: 600 mg via INTRAVENOUS
  Filled 2016-08-17: qty 50

## 2016-08-17 MED ORDER — IOPAMIDOL (ISOVUE-300) INJECTION 61%
INTRAVENOUS | Status: AC
  Administered 2016-08-17: 75 mL
  Filled 2016-08-17: qty 75

## 2016-08-17 MED ORDER — DEXAMETHASONE SODIUM PHOSPHATE 10 MG/ML IJ SOLN
10.0000 mg | Freq: Once | INTRAMUSCULAR | Status: AC
  Administered 2016-08-17: 10 mg via INTRAVENOUS
  Filled 2016-08-17: qty 1

## 2016-08-17 MED ORDER — DIPHENHYDRAMINE HCL 50 MG/ML IJ SOLN
25.0000 mg | Freq: Once | INTRAMUSCULAR | Status: AC
  Administered 2016-08-17: 25 mg via INTRAVENOUS
  Filled 2016-08-17: qty 1

## 2016-08-17 NOTE — ED Triage Notes (Signed)
Patient arrives with complaint of throat fullness, excess salivation, and fatigue. States onset 2 days ago. Explains that it "feels like i'm drowning". Denies choking prior to having this issue. Explains that the back of his neck and front lateral portions of his neck are painful. Unable to tolerate fluids well without feeling as though he is choking. Denies history of similar.

## 2016-08-17 NOTE — ED Notes (Signed)
Pt placed on 2L 02 by Wiley Ford per Dr Bebe ShaggyWickline order

## 2016-08-17 NOTE — ED Provider Notes (Signed)
[facility]EMERGENCY DEPT Provider Note   CSN: [id number] Arrival date & time: 08/16/16  2357 By signing my name below, I, Levon Hedger, attest that this documentation has been prepared under the direction and in the presence of Zadie Rhine, MD . Electronically Signed: Levon Hedger, Scribe. 08/17/2016. 1:40 AM.   History   Chief Complaint Chief Complaint  Patient presents with  . Excess Saliva  . Neck Pain  . Fatigue    HPI Caleb Pace is a 46 y.o. male with a history of HTN who presents to the Emergency Department complaining of difficulty swallowing onset two days ago.  Pt started on Lisinopril 2 months ago. Pt reports that he is unable to swallow his saliva and is spitting frequently. He is unable to tolerate fluids without feeling as if he is choking. He reports associated difficulty swallowing, abdominal pain, sore throat and neck pain. Sore throat is exacerbated by swallowing. No OTC treatments tried for these symptoms PTA.  Per daughter, pt has had similar symptoms in the past, but it has never been this severe.He denies any rash, fever, vomiting, or chest pain. Family assisted with translation due to lack of interpreter.    The history is provided by the patient and a relative. A language interpreter was used (daughter provided interpretation).  Neck Pain   This is a new problem. The current episode started 2 days ago. The problem occurs constantly. The problem has not changed since onset.The pain is associated with an unknown factor. The pain is present in the generalized neck. The pain is moderate. The symptoms are aggravated by swallowing. The pain is the same all the time. Pertinent negatives include no chest pain. He has tried nothing for the symptoms.    Past Medical History:  Diagnosis Date  . GERD (gastroesophageal reflux disease)   . Helicobacter pylori gastritis   . Hypertension     Patient Active Problem List   Diagnosis Date Noted  . Essential  hypertension 06/19/2016  . Psoriasis 01/30/2016  . Other dysphagia 12/22/2012  . GERD (gastroesophageal reflux disease) 04/06/2012  . History of Helicobacter pylori infection 03/16/2012  . Dyspepsia 03/16/2012    Past Surgical History:  Procedure Laterality Date  . ESOPHAGOGASTRODUODENOSCOPY N/A 12/22/2012   Procedure: ESOPHAGOGASTRODUODENOSCOPY (EGD);  Surgeon: Meryl Dare, MD;  Location: [facility];  Service: Endoscopy;  Laterality: N/A;     Home Medications    Prior to Admission medications   Medication Sig Start Date End Date Taking? Authorizing Provider  lisinopril-hydrochlorothiazide (PRINZIDE,ZESTORETIC) 10-12.5 MG tablet Take 1 tablet by mouth daily. 06/19/16   Georgina Quint, MD    Family History History reviewed. No pertinent family history.  Social History Social History  Substance Use Topics  . Smoking status: Former Smoker    Packs/day: 0.25    Years: 10.00    Types: Cigarettes  . Smokeless tobacco: Never Used  . Alcohol use 0.0 oz/week    5 - 6 Cans of beer per week    Allergies   Asa [aspirin] and Lactose intolerance (gi)   Review of Systems Review of Systems  Constitutional: Negative for fever.  HENT: Positive for sore throat and trouble swallowing.   Cardiovascular: Negative for chest pain.  Gastrointestinal: Negative for vomiting.  Musculoskeletal: Positive for neck pain.  Skin: Negative for rash.  All other systems reviewed and are negative.  Physical Exam Updated Vital Signs BP (!) 133/97 (BP Location: Right Arm)   Pulse 73   Temp 98.1 F (36.7 C) (Oral)  Resp 20   SpO2 99%   Physical Exam CONSTITUTIONAL: Well developed/well nourished HEAD: Normocephalic/atraumatic EYES: EOMI/PERRL ENMT: Mucous membranes moist. Mild uvular edema. No stridor. Normal phonation. No drooling, but patient spitting frequently  No facial/tongue edema noted NECK: supple no meningeal signs SPINE/BACK:entire spine nontender CV: S1/S2 noted, no  murmurs/rubs/gallops noted LUNGS: Lungs are clear to auscultation bilaterally, no apparent distress ABDOMEN: soft, nontender, no rebound or guarding, bowel sounds noted throughout abdomen NEURO: Pt is awake/alert/appropriate, moves all extremitiesx4.  No facial droop.   EXTREMITIES: pulses normal/equal, full ROM SKIN: warm, color normal PSYCH: no abnormalities of mood noted, alert and oriented to situation   ED Treatments / Results  DIAGNOSTIC STUDIES:  Oxygen Saturation is 99% on RA, normal by my interpretation.    COORDINATION OF CARE:  1:32 AM Will order CBC, BMP, and DG neck.  Discussed treatment plan which includes decadron, benadryl and Protonix with pt at bedside and pt agreed to plan.   Labs (all labs ordered are listed, but only abnormal results are displayed) Labs Reviewed  BASIC METABOLIC PANEL - Abnormal; Notable for the following:       Result Value   Potassium 3.4 (*)    All other components within normal limits  CBC WITH DIFFERENTIAL/PLATELET    EKG  EKG Interpretation None       Radiology Dg Neck Soft Tissue  Result Date: 08/17/2016 CLINICAL DATA:  Pain.  Throat fullness. EXAM: NECK SOFT TISSUES - 1+ VIEW COMPARISON:  None. FINDINGS: Retropharyngeal soft tissue fullness from C3 through C5 with questionable airway narrowing. Epiglottis appears normal. No radio-opaque foreign body identified. IMPRESSION: Retropharyngeal soft tissue fullness from C3 through C5 with questionable airway narrowing. Recommend CT neck with contrast. Electronically Signed   By: Rubye Oaks M.D.   On: 08/17/2016 02:35   Ct Soft Tissue Neck W Contrast  Result Date: 08/17/2016 CLINICAL DATA:  Odynophagia.  Excess salivation EXAM: CT NECK WITH CONTRAST TECHNIQUE: Multidetector CT imaging of the neck was performed using the standard protocol following the bolus administration of intravenous contrast. CONTRAST:  75mL ISOVUE-300 IOPAMIDOL (ISOVUE-300) INJECTION 61% COMPARISON:  Soft  tissue neck radiograph 08/17/2016 FINDINGS: Pharynx and larynx: --Nasopharynx: Fossae of Rosenmuller are clear. Normal adenoid tonsils for age. --Oral cavity and oropharynx: The palatine tonsils are enlarged. There is mild enlargement of the lingual tonsils. --Hypopharynx: Normal vallecula and pyriform sinuses. --Larynx: Normal epiglottis and pre-epiglottic space. Normal aryepiglottic and vocal folds. --Retropharyngeal space: No abscess, effusion or lymphadenopathy. Salivary glands: --Parotid: No mass lesion or inflammation. No sialolithiasis or ductal dilatation. --Submandibular: Symmetric without inflammation. No sialolithiasis or ductal dilatation. --Sublingual: Normal. No ranula or other visible lesion of the base of tongue and floor of mouth. Thyroid: Normal. Lymph nodes: No enlarged or abnormal density lymph nodes. Vascular: Major cervical vessels are patent. Limited intracranial: Normal. Visualized orbits: Normal. Mastoids and visualized paranasal sinuses: No fluid levels or advanced mucosal thickening. No mastoid effusion. Skeleton: No bony spinal canal stenosis. No lytic or blastic lesions. Upper chest: Clear. Other: None. IMPRESSION: 1. Enlarged palatine and lingual tonsils, likely indicating acute tonsillopharyngitis. No peritonsillar abscess or fluid collection. 2. No retropharyngeal abscess, effusion or lymphadenopathy. The airway is widely patent. Normal epiglottis. Electronically Signed   By: Deatra Robinson M.D.   On: 08/17/2016 03:53    Procedures Procedures (including critical care time)  Medications Ordered in ED Medications  clindamycin (CLEOCIN) IVPB 600 mg (600 mg Intravenous New Bag/Given 08/17/16 0418)  diphenhydrAMINE (BENADRYL) injection 25 mg (25 mg  Intravenous Given 08/17/16 0221)  dexamethasone (DECADRON) injection 10 mg (10 mg Intravenous Given 08/17/16 0226)  pantoprazole (PROTONIX) injection 40 mg (40 mg Intravenous Given 08/17/16 0222)  iopamidol (ISOVUE-300) 61 % injection (75  mLs  Contrast Given 08/17/16 0313)     Initial Impression / Assessment and Plan / ED Course  I have reviewed the triage vital signs and the nursing notes.  Pertinent labs & imaging results that were available during my care of the patient were reviewed by me and considered in my medical decision making (see chart for details).     2:59 AM Pt with abnormal neck xray He is awake/alert, no stridor, no distress Oxygen ordered Will get CT imaging of neck Pt agreeable 4:46 AM CT reveals tonsillitis No other acute findings Pt improved No distress He is handling his secretions Will d/c home with clindamycin Utilized spanish interpreter [phone] Advised to STOP lisinopril as may play a role with uvular edema Will need PCP followup in 1-2 weeks We discussed strict ER return precautions  Final Clinical Impressions(s) / ED Diagnoses   Final diagnoses:  Tonsillopharyngitis    New Prescriptions New Prescriptions   CLINDAMYCIN (CLEOCIN) 300 MG CAPSULE    Take 1 capsule (300 mg total) by mouth 4 (four) times daily. X 7 days     Zadie RhineWickline, Eli Pattillo, MD 08/17/16 [id number]

## 2016-08-17 NOTE — ED Notes (Signed)
Pt to xray via stretcher

## 2016-08-18 ENCOUNTER — Encounter: Payer: Self-pay | Admitting: Emergency Medicine

## 2016-08-18 ENCOUNTER — Ambulatory Visit (INDEPENDENT_AMBULATORY_CARE_PROVIDER_SITE_OTHER): Payer: Self-pay | Admitting: Emergency Medicine

## 2016-08-18 VITALS — BP 128/81 | HR 67 | Temp 97.9°F | Resp 16 | Ht 66.0 in | Wt 180.0 lb

## 2016-08-18 DIAGNOSIS — J039 Acute tonsillitis, unspecified: Secondary | ICD-10-CM

## 2016-08-18 DIAGNOSIS — I10 Essential (primary) hypertension: Secondary | ICD-10-CM

## 2016-08-18 DIAGNOSIS — J029 Acute pharyngitis, unspecified: Secondary | ICD-10-CM

## 2016-08-18 DIAGNOSIS — R12 Heartburn: Secondary | ICD-10-CM

## 2016-08-18 MED ORDER — PREDNISONE 20 MG PO TABS: 40.0000 mg | ORAL_TABLET | Freq: Every day | ORAL | 0 refills | Status: AC

## 2016-08-18 MED ORDER — AMLODIPINE BESYLATE 5 MG PO TABS: 5.0000 mg | ORAL_TABLET | Freq: Every day | ORAL | 3 refills | Status: AC

## 2016-08-18 MED ORDER — AMOXICILLIN-POT CLAVULANATE 875-125 MG PO TABS: 1.0000 | ORAL_TABLET | Freq: Two times a day (BID) | ORAL | 0 refills | Status: AC

## 2016-08-18 NOTE — Assessment & Plan Note (Signed)
Will stop Lisinopril and change to Amlodipine in case oral edema is related to ACE inhibitor.

## 2016-08-18 NOTE — Progress Notes (Signed)
Caleb Pace 46 y.o.   Chief Complaint  Patient presents with  . throat pain    patient [facility]  . Oral Pain    HISTORY OF PRESENT ILLNESS: This is a 46 y.o. male complaining of throat pain x 3 days; seen in the ED 2 days ago; had normal blood work and CT soft tissue neck showed tonsillopharyngitis without abscess or any complication. Still c/o pain to mouth and throat; was started on Clindamycin; was given Decadron shot in ED.  HPI   Prior to Admission medications   Medication Sig Start Date End Date Taking? Authorizing Provider  clindamycin (CLEOCIN) 300 MG capsule Take 1 capsule (300 mg total) by mouth 4 (four) times daily. X 7 days 08/17/16  Yes Zadie RhineWickline, Donald, MD  amLODipine (NORVASC) 5 MG tablet Take 1 tablet (5 mg total) by mouth daily. 08/18/16   Georgina QuintSagardia, Landin Tallon Jose, MD  amoxicillin-clavulanate (AUGMENTIN) 875-125 MG tablet Take 1 tablet by mouth 2 (two) times daily. 08/18/16 08/25/16  Georgina QuintSagardia, Hanish Laraia Jose, MD  predniSONE (DELTASONE) 20 MG tablet Take 2 tablets (40 mg total) by mouth daily with breakfast. 08/18/16 08/23/16  Georgina QuintSagardia, Balraj Brayfield Jose, MD    Allergies  Allergen Reactions  . Asa [Aspirin]     Upset stomach   . Lactose Intolerance (Gi)     Upset stomach     Patient Active Problem List   Diagnosis Date Noted  . Tonsillopharyngitis 08/18/2016  . Essential hypertension 06/19/2016  . Psoriasis 01/30/2016  . Other dysphagia 12/22/2012  . GERD (gastroesophageal reflux disease) 04/06/2012  . History of Helicobacter pylori infection 03/16/2012  . Dyspepsia 03/16/2012    Past Medical History:  Diagnosis Date  . GERD (gastroesophageal reflux disease)   . Helicobacter pylori gastritis   . Hypertension     Past Surgical History:  Procedure Laterality Date  . ESOPHAGOGASTRODUODENOSCOPY N/A 12/22/2012   Procedure: ESOPHAGOGASTRODUODENOSCOPY (EGD);  Surgeon: Meryl DareMalcolm T Stark, MD;  [facility];  Service: Endoscopy;   Laterality: N/A;    Social History   Social History  . Marital status: Married    Spouse name: N/A  . Number of children: 0  . Years of education: N/A   Occupational History  . PAINTER    Social History Main Topics  . Smoking status: Former Smoker    Packs/day: 0.25    Years: 10.00    Types: Cigarettes  . Smokeless tobacco: Never Used  . Alcohol use 0.0 oz/week    5 - 6 Cans of beer per week  . Drug use: No  . Sexual activity: Not on file   Other Topics Concern  . Not on file   Social History Narrative  . No narrative on file    No family history on file.   Review of Systems  Constitutional: Negative.  Negative for chills and fever.  HENT: Positive for sore throat.   Eyes: Negative for blurred vision and double vision.  Respiratory: Negative for cough and shortness of breath.   Cardiovascular: Negative.  Negative for chest pain, palpitations and leg swelling.  Gastrointestinal: Positive for heartburn (r/o GERD). Negative for abdominal pain, blood in stool, diarrhea, melena, nausea and vomiting.       Chronic intermittent swallowing problems x 1-2 years.  Genitourinary: Negative for dysuria and hematuria.  Musculoskeletal: Negative.   Skin: Negative.  Negative for rash.  Neurological: Negative.  Negative for dizziness and headaches.  Endo/Heme/Allergies: Negative.   All other systems reviewed and  are negative.   Vitals:   08/18/16 1137  BP: 128/81  Pulse: 67  Resp: 16  Temp: 97.9 F (36.6 C)    Physical Exam  Constitutional: He is oriented to person, place, and time. He appears well-developed and well-nourished.  HENT:  Head: Normocephalic and atraumatic.  Nose: Nose normal.  Mouth/Throat: Uvula is midline and mucous membranes are normal. Posterior oropharyngeal edema and posterior oropharyngeal erythema present. No oropharyngeal exudate or tonsillar abscesses.  Eyes: Pupils are equal, round, and reactive to light. Conjunctivae and EOM are normal.   Neck: Normal range of motion. Neck supple. No JVD present. No thyromegaly present.  Cardiovascular: Normal rate, regular rhythm, normal heart sounds and intact distal pulses.   Pulmonary/Chest: Effort normal and breath sounds normal.  Abdominal: Soft. Bowel sounds are normal. He exhibits no distension. There is no tenderness.  Musculoskeletal: Normal range of motion.  Lymphadenopathy:    He has no cervical adenopathy.  Neurological: He is alert and oriented to person, place, and time. No sensory deficit. He exhibits normal muscle tone.  Skin: Skin is warm and dry. Capillary refill takes less than 2 seconds. No rash noted.  Psychiatric: He has a normal mood and affect. His behavior is normal.  Vitals reviewed.  Essential hypertension Will stop Lisinopril and change to Amlodipine in case oral edema is related to ACE inhibitor.    ASSESSMENT & PLAN:  Caleb Pace was seen today for throat pain and oral pain.  Diagnoses and all orders for this visit:  Tonsillopharyngitis Comments: maybe secondary to GERD Orders: -     amoxicillin-clavulanate (AUGMENTIN) 875-125 MG tablet; Take 1 tablet by mouth 2 (two) times daily. -     predniSONE (DELTASONE) 20 MG tablet; Take 2 tablets (40 mg total) by mouth daily with breakfast.  Essential hypertension -     amLODipine (NORVASC) 5 MG tablet; Take 1 tablet (5 mg total) by mouth daily.  Chronic heartburn Comments: suspected GERD   Patient Instructions     Stop Clindamycin and Lisinopril. Start medications as prescribed. Return if worse.  IF you received an x-ray today, you will receive an invoice from [facility]. Please contact [facility] at [phone] with questions or concerns regarding your invoice.   IF you received labwork today, you will receive an invoice from [facility]. Please contact [facility] at [phone] with questions or concerns regarding your invoice.   Our billing staff will not be able to assist you  with questions regarding bills from these companies.  You will be contacted with the lab results as soon as they are available. The fastest way to get your results is to activate your My Chart account. Instructions are located on the last page of this paperwork. If you have not heard from [place] regarding the results in 2 weeks, please contact this office.        Edwina Barth, MD [facility]

## 2016-08-18 NOTE — Patient Instructions (Addendum)
   Stop Clindamycin and Lisinopril. Start medications as prescribed. Return if worse.  IF you received an x-ray today, you will receive an invoice from [facility]. Please contact [facility] at [phone] with questions or concerns regarding your invoice.   IF you received labwork today, you will receive an invoice from [facility]. Please contact [facility] at [phone] with questions or concerns regarding your invoice.   Our billing staff will not be able to assist you with questions regarding bills from these companies.  You will be contacted with the lab results as soon as they are available. The fastest way to get your results is to activate your My Chart account. Instructions are located on the last page of this paperwork. If you have not heard from us regarding the results in 2 weeks, please contact this office.

## 2016-08-23 ENCOUNTER — Ambulatory Visit (INDEPENDENT_AMBULATORY_CARE_PROVIDER_SITE_OTHER): Payer: Self-pay | Admitting: Osteopathic Medicine

## 2016-08-23 ENCOUNTER — Encounter: Payer: Self-pay | Admitting: Osteopathic Medicine

## 2016-08-23 ENCOUNTER — Ambulatory Visit: Payer: Self-pay | Admitting: Family Medicine

## 2016-08-23 VITALS — BP 125/85 | HR 76 | Temp 98.4°F | Resp 16 | Ht 66.0 in | Wt 180.0 lb

## 2016-08-23 DIAGNOSIS — R12 Heartburn: Secondary | ICD-10-CM

## 2016-08-23 DIAGNOSIS — R079 Chest pain, unspecified: Secondary | ICD-10-CM

## 2016-08-23 DIAGNOSIS — K2100 Gastro-esophageal reflux disease with esophagitis, without bleeding: Secondary | ICD-10-CM

## 2016-08-23 DIAGNOSIS — I10 Essential (primary) hypertension: Secondary | ICD-10-CM

## 2016-08-23 MED ORDER — OMEPRAZOLE 40 MG PO CPDR: 40.0000 mg | DELAYED_RELEASE_CAPSULE | Freq: Every day | ORAL | 2 refills | Status: AC

## 2016-08-23 MED ORDER — GI COCKTAIL ~~LOC~~
30.0000 mL | Freq: Once | ORAL | Status: AC
  Administered 2016-08-23: 30 mL via ORAL

## 2016-08-23 MED ORDER — RANITIDINE HCL 300 MG PO TABS: 300.0000 mg | ORAL_TABLET | Freq: Every day | ORAL | 2 refills | Status: DC

## 2016-08-23 NOTE — Patient Instructions (Signed)
     IF you received an x-ray today, you will receive an invoice from [facility]. Please contact [facility] at [phone] with questions or concerns regarding your invoice.   IF you received labwork today, you will receive an invoice from [facility]. Please contact [facility] at [phone] with questions or concerns regarding your invoice.   Our billing staff will not be able to assist you with questions regarding bills from these companies.  You will be contacted with the lab results as soon as they are available. The fastest way to get your results is to activate your My Chart account. Instructions are located on the last page of this paperwork. If you have not heard from us regarding the results in 2 weeks, please contact this office.     

## 2016-08-23 NOTE — Progress Notes (Signed)
HPI: Caleb Caleb Pace a 46 y.o. male  who [facility], 08/23/16,  for chief complaint of:  Chief Complaint  Patient presents with  . Heartburn    & Abdominal pain after starting Amlodipine on 7/23   This morning after taking medications, he developed epigastric/left upper quadrant abdominal pain/burning radiating up to the mouth with a bad taste. He Pace also complaining of some chest pain and nausea and feeling a bit short winded. Previously on PPI but this Pace not currently on his medication list. He Pace not sure why he Pace no longer taking an antacid medication. Long history of GERD and multiple workup for this, see below for record review.  States he has been on amlodipine since about March the records indicate he was just started on this by his PCP 5 days ago.  GI cocktail was administered and patient states his pain felt a lot better but he started to feel catching feeling in the throat, pointing to just below [place] apple. Vitals were reassessed, patient states breathing Pace normal, no palpitations or chest pain, no trouble breathing.   Records reviewed:  Seen recently 08/18/2016, 5 days ago by Dr. Alvy BimlerSagardia. At that time following up from emergency department visit for tonsillopharyngitis and oral pain. No concerns on labs or CT of the neck at that point. Review of systems at that point was positive for GERD as well. Dr. Alvy BimlerSagardia at that time was suspicious for tonsillopharyngitis possible secondary to GERD, was treated with antibiotics Augmentin as well as prednisone, patient was advised to return if worse. No GERD medications were initiated at that time.  Patient has been here a few times over the course of the past 6 months for similar symptoms associated with labile blood pressure, chest pain and nausea. History of GERD and dysphagia.  EGD 11/2012 showed LA Class B esophagitis, PPI was increased  Patient speaks proficient English  though spent issues noted as his first-line which in the chart. He Pace accompanied by wife who assists with history   Past medical, surgical, social and family history reviewed: Patient Active Problem List   Diagnosis Date Noted  . Tonsillopharyngitis 08/18/2016  . Chronic heartburn 08/18/2016  . Essential hypertension 06/19/2016  . Psoriasis 01/30/2016  . Other dysphagia 12/22/2012  . GERD (gastroesophageal reflux disease) 04/06/2012  . History of Helicobacter pylori infection 03/16/2012  . Dyspepsia 03/16/2012   Past Surgical History:  Procedure Laterality Date  . ESOPHAGOGASTRODUODENOSCOPY N/A 12/22/2012   Procedure: ESOPHAGOGASTRODUODENOSCOPY (EGD);  Surgeon: Meryl DareMalcolm T Stark, MD;  [facility];  Service: Endoscopy;  Laterality: N/A;   Social History  Substance Use Topics  . Smoking status: Former Smoker    Packs/day: 0.25    Years: 10.00    Types: Cigarettes  . Smokeless tobacco: Never Used  . Alcohol use 0.0 oz/week    5 - 6 Cans of beer per week   No family history on file.   Current medication list and allergy/intolerance information reviewed:  Only medication Pace amlodipine.  Allergies  Allergen Reactions  . Asa [Aspirin]     Upset stomach   . Lactose Intolerance (Gi)     Upset stomach       Review of Systems:  Constitutional:  No  fever, no chills, No recent illness  HEENT: No  headache, no vision change  Cardiac: +chest pain, No  pressure, No palpitations, No  Orthopnea   Respiratory:  +shortness of breath. No  Cough  Gastrointestinal: +abdominal pain, +nausea, No  vomiting,  No  blood in stool, No  diarrhea, No  constipation   Musculoskeletal: No new myalgia/arthralgia  Skin: No  Rash  Neurologic: No  weakness, No  dizziness   Exam:  BP (!) 145/88   Pulse 77   Temp 98.4 F (36.9 C) (Oral)   Resp 16   Ht 5\' 6"  (1.676 m)   Wt 180 lb (81.6 kg)   SpO2 95%   BMI 29.05 kg/m   Constitutional: VS see above. General Appearance:  alert, well-developed, well-nourished, NAD  Eyes: Normal lids and conjunctive, non-icteric sclerae  Ears, Nose, Mouth, Throat: MMM, Normal external inspection ears/nares/mouth/lips/gums. Pharynx/tonsils no erythema, no exudate. Nasal mucosa normal.   Neck: No masses, trachea midline. No thyroid enlargement. No tenderness/mass appreciated. No lymphadenopathy  Respiratory: Normal respiratory effort. no wheeze, no rhonchi, no rales  Cardiovascular: S1/S2 normal, no murmur, no rub/gallop auscultated. RRR. No lower extremity edema.   Gastrointestinal: Reports tender to palpation epigastrium and left upper quadrant, no distention, no rebound/guarding, no masses. No hepatomegaly, no splenomegaly. No hernia appreciated. Bowel sounds normal. Rectal exam deferred.   Musculoskeletal: Gait normal. No clubbing/cyanosis of digits. Some chest wall tenderness on left with palpation  Neurological: Normal balance/coordination. No tremor.   Skin: warm, dry, intact. No diaphoresis   Psychiatric: Normal judgment/insight. Normal mood and affect. Oriented x3.    Pertinent labs reviewed:  H. pylori breath test was negative 06/19/2016 08/17/2016: Normal CBC and BMP exception of slightly decreased potassium at 3.4  EKG interpretation: Rate: 74 Rhythm: sinus Nonspecific ST-T changes consistent with previous EKGs. No ST/T changes concerning for acute ischemia/infarct     ASSESSMENT/PLAN:   Pain improved after GI cocktail, patient experienced some catching in the throat after this but no signs of anaphylaxis. Was kept a bit longer for observation and patient felt safe to leave once he was feeling a bit better  Advised would restart antacid regimen, I'm going to go ahead and initiate double therapy with PPI and H2 blocker given severity and frequency of symptoms. Seems to me may need another EGD, lack of insurance Pace a barrier here though.  Advised he will need to follow-up with PCP in one month for  reevaluation, sooner if needed/worse   Heartburn - Plan: gi cocktail (Maalox,Lidocaine,Donnatal), omeprazole (PRILOSEC) 40 MG capsule, ranitidine (ZANTAC) 300 MG tablet  Chest pain, unspecified type - Plan: gi cocktail (Maalox,Lidocaine,Donnatal), EKG 12-Lead  Essential hypertension - Plan: EKG 12-Lead  Gastroesophageal reflux disease with esophagitis - Plan: omeprazole (PRILOSEC) 40 MG capsule, ranitidine (ZANTAC) 300 MG tablet     Visit summary with medication list and pertinent instructions was printed for patient to review. All questions at time of visit were answered - patient instructed to contact office with any additional concerns. ER/RTC precautions were reviewed with the patient. Follow-up plan: Return in about 4 weeks (around 09/20/2016) for recheck heartburn with primary doctor .

## 2016-08-26 ENCOUNTER — Emergency Department (HOSPITAL_COMMUNITY): Payer: Self-pay

## 2016-08-26 ENCOUNTER — Emergency Department (HOSPITAL_COMMUNITY)
Admission: EM | Admit: 2016-08-26 | Discharge: 2016-08-26 | Disposition: A | Discharge status: Home / Self Care | Payer: Self-pay | Attending: Emergency Medicine | Admitting: Emergency Medicine

## 2016-08-26 ENCOUNTER — Encounter (HOSPITAL_COMMUNITY): Payer: Self-pay | Admitting: Emergency Medicine

## 2016-08-26 DIAGNOSIS — R079 Chest pain, unspecified: Secondary | ICD-10-CM

## 2016-08-26 DIAGNOSIS — Z79899 Other long term (current) drug therapy: Secondary | ICD-10-CM | POA: Insufficient documentation

## 2016-08-26 DIAGNOSIS — I10 Essential (primary) hypertension: Secondary | ICD-10-CM | POA: Insufficient documentation

## 2016-08-26 DIAGNOSIS — Z87891 Personal history of nicotine dependence: Secondary | ICD-10-CM | POA: Insufficient documentation

## 2016-08-26 LAB — I-STAT CHEM 8, ED
BUN: 10 mg/dL (ref 6–20)
Calcium, Ion: 1.14 mmol/L — ABNORMAL LOW (ref 1.15–1.40)
Chloride: 102 mmol/L (ref 101–111)
Creatinine, Ser: 0.5 mg/dL — ABNORMAL LOW (ref 0.61–1.24)
Glucose, Bld: 103 mg/dL — ABNORMAL HIGH (ref 65–99)
HCT: 44 % (ref 39.0–52.0)
HEMOGLOBIN: 15 g/dL (ref 13.0–17.0)
Potassium: 3.4 mmol/L — ABNORMAL LOW (ref 3.5–5.1)
SODIUM: 140 mmol/L (ref 135–145)
TCO2: 25 mmol/L (ref 0–100)

## 2016-08-26 LAB — I-STAT TROPONIN, ED: Troponin i, poc: 0 ng/mL (ref 0.00–0.08)

## 2016-08-26 MED ORDER — GI COCKTAIL ~~LOC~~
30.0000 mL | Freq: Once | ORAL | Status: AC
  Administered 2016-08-26: 30 mL via ORAL
  Filled 2016-08-26: qty 30

## 2016-08-26 NOTE — ED Notes (Signed)
This RN inquired if MD would like me to place standing orders since pt has not had any orders placed yet. MD advised he would place orders for patient at this time

## 2016-08-26 NOTE — ED Triage Notes (Signed)
Pt arrives via [facility] for c/o left sided chest and abdominal pain as well as left leg pain, denies sob. Pt reports pain began after he ate lunch. Hx of gerd. Pt received 1 sl nitro with no relief, refused ASA. Pt a/ox4. resp e/u, nad.

## 2016-08-26 NOTE — Discharge Instructions (Signed)
Take all of your medicine as directed.  You can also use Maalox before meals and at bedtime as needed to help control your symptoms.  It is important to follow-up with a gastroenterologist for further evaluation of your esophagitis.

## 2016-08-26 NOTE — ED Provider Notes (Signed)
[facility]-EMERGENCY DEPT Provider Note   CSN: [id number] Arrival date & time: 08/26/16  1653     History   Chief Complaint Chief Complaint  Patient presents with  . Chest Pain    HPI Caleb Pace is a 46 y.o. male.  She presents for evaluation of chest pain radiating to neck, left arm and left leg.  Onset of symptoms, today.  They are similar to prior symptoms.  He has had numerous evaluations for similar problems including 3 days ago by his PCP.  Is also had 2 rather recent evaluations in the emergency department, where he was scanned, neck and abdomen for various complaints.  He has been treated with oral antibiotics, clindamycin, change to ampicillin, PPIs, steroids, and GI cocktail.  3 days ago his PCP noted that his last endoscopy, was in 2014, and showed esophagitis.  He concluded that the patient needed another EGD, because of persistent symptoms, but that the patient was unable to proceed with that because of financial difficulties.  The patient states he is taking his current prescribed medications.  He also wonders where his blood pressure is sometimes high.  I attempted to discuss with him about salt intake, but he was vague about whether or not he is following a low-sodium diet.  He denies fever, chills, cough, persistent shortness of breath, dizziness, weakness or paresthesia.  There are no other known modifying factors.  HPI  Past Medical History:  Diagnosis Date  . GERD (gastroesophageal reflux disease)   . Helicobacter pylori gastritis   . Hypertension     Patient Active Problem List   Diagnosis Date Noted  . Tonsillopharyngitis 08/18/2016  . Chronic heartburn 08/18/2016  . Essential hypertension 06/19/2016  . Psoriasis 01/30/2016  . Other dysphagia 12/22/2012  . GERD (gastroesophageal reflux disease) 04/06/2012  . History of Helicobacter pylori infection 03/16/2012  . Dyspepsia 03/16/2012    Past Surgical History:  Procedure Laterality Date  .  ESOPHAGOGASTRODUODENOSCOPY N/A 12/22/2012   Procedure: ESOPHAGOGASTRODUODENOSCOPY (EGD);  Surgeon: Meryl Dare, MD;  [facility];  Service: Endoscopy;  Laterality: N/A;       Home Medications    Prior to Admission medications   Medication Sig Start Date End Date Taking? Authorizing Provider  amLODipine (NORVASC) 5 MG tablet Take 1 tablet (5 mg total) by mouth daily. 08/18/16   Georgina Quint, MD  clindamycin (CLEOCIN) 300 MG capsule Take 1 capsule (300 mg total) by mouth 4 (four) times daily. X 7 days 08/17/16   Zadie Rhine, MD  omeprazole (PRILOSEC) 40 MG capsule Take 1 capsule (40 mg total) by mouth daily. 08/23/16   Sunnie Nielsen, DO  ranitidine (ZANTAC) 300 MG tablet Take 1 tablet (300 mg total) by mouth at bedtime. 08/23/16   Sunnie Nielsen, DO    Family History No family history on file.  Social History Social History  Substance Use Topics  . Smoking status: Former Smoker    Packs/day: 0.25    Years: 10.00    Types: Cigarettes  . Smokeless tobacco: Never Used  . Alcohol use 0.0 oz/week    5 - 6 Cans of beer per week     Allergies   Asa [aspirin] and Lactose intolerance (gi)   Review of Systems Review of Systems  All other systems reviewed and are negative.    Physical Exam Updated Vital Signs BP (!) 128/99   Pulse 74   Temp 98.7 F (37.1 C) (Oral)   Resp 20   SpO2 96%  Physical Exam  Constitutional: He is oriented to person, place, and time. He appears well-developed and well-nourished. He appears distressed (He is anxious).  HENT:  Head: Normocephalic and atraumatic.  Right Ear: External ear normal.  Left Ear: External ear normal.  No oral angioedema  Eyes: Pupils are equal, round, and reactive to light. Conjunctivae and EOM are normal.  Neck: Normal range of motion and phonation normal. Neck supple.  Cardiovascular: Normal rate, regular rhythm and normal heart sounds.   No cardiac rub or murmur  Pulmonary/Chest:  Effort normal and breath sounds normal. He exhibits no bony tenderness.  No respiratory distress  Abdominal: Soft. He exhibits no distension. There is no tenderness. There is no guarding.  Musculoskeletal: Normal range of motion.  No extremity edema or tenderness.  Normal range of motion arms and legs bilaterally.  Neurological: He is alert and oriented to person, place, and time. No cranial nerve deficit or sensory deficit. He exhibits normal muscle tone. Coordination normal.  Skin: Skin is warm, dry and intact.  Psychiatric: His behavior is normal. Judgment and thought content normal.  Fearful, anxious appearance  Nursing note and vitals reviewed.    ED Treatments / Results  Labs (all labs ordered are listed, but only abnormal results are displayed) Labs Reviewed  I-STAT CHEM 8, ED - Abnormal; Notable for the following:       Result Value   Potassium 3.4 (*)    Creatinine, Ser 0.50 (*)    Glucose, Bld 103 (*)    Calcium, Ion 1.14 (*)    All other components within normal limits  I-STAT TROPONIN, ED    EKG  EKG Interpretation  Date/Time:  Tuesday August 26 2016 16:55:03 EDT Ventricular Rate:  76 PR Interval:    QRS Duration: 88 QT Interval:  377 QTC Calculation: 424 R Axis:   52 Text Interpretation:  Sinus rhythm since last tracing no significant change Confirmed by Mancel BaleWentz, Brylei Pedley [phone]([id number]) on 08/26/2016 4:57:54 PM       Radiology Dg Chest Port 1 View  Result Date: 08/26/2016 CLINICAL DATA:  46 year old male with chest pain. EXAM: PORTABLE CHEST 1 VIEW COMPARISON:  Chest radiograph dated 01/02/2016 FINDINGS: The heart size and mediastinal contours are within normal limits. Both lungs are clear. The visualized skeletal structures are unremarkable. IMPRESSION: No active disease. Electronically Signed   By: Elgie CollardArash  Radparvar M.D.   On: 08/26/2016 19:14    Procedures Procedures (including critical care time)  Medications Ordered in ED Medications  gi cocktail  (Maalox,Lidocaine,Donnatal) (30 mLs Oral Given 08/26/16 1902)     Initial Impression / Assessment and Plan / ED Course  I have reviewed the triage vital signs and the nursing notes.  Pertinent labs & imaging results that were available during my care of the patient were reviewed by me and considered in my medical decision making (see chart for details).      No data found.   7:39 PM Reevaluation with update and discussion. After initial assessment and treatment, an updated evaluation reveals no change in clinical status.  Findings discussed with the patient and all questions answered. Laniqua Torrens L    Final Clinical Impressions(s) / ED Diagnoses   Final diagnoses:  Nonspecific chest pain   Nonspecific chest pain, recurrent, evaluated numerous times recently. He has been seeking care from multiple providers at multiple locations, with multiple reassuring evaluations. No indication for further evaluation, treatment in ED setting.  Nursing Notes Reviewed/ Care Coordinated Applicable Imaging Reviewed Interpretation of Laboratory Data  incorporated into ED treatment  The patient appears reasonably screened and/or stabilized for discharge and I doubt any other medical condition or other [facility] requiring further screening, evaluation, or treatment in the ED at this time prior to discharge.  Plan: Home Medications- continue usual; Home Treatments- rest; return here if the recommended treatment, does not improve the symptoms; Recommended follow up- PCP, when necessary   New Prescriptions Discharge Medication List as of 08/26/2016  7:54 PM       Mancel BaleWentz, Shanine Kreiger, MD 08/28/16 [phone]

## 2016-09-01 ENCOUNTER — Encounter: Payer: Self-pay | Admitting: Emergency Medicine

## 2016-09-01 ENCOUNTER — Ambulatory Visit (INDEPENDENT_AMBULATORY_CARE_PROVIDER_SITE_OTHER): Payer: Self-pay | Admitting: Emergency Medicine

## 2016-09-01 VITALS — BP 126/85 | HR 71 | Temp 98.2°F | Resp 16 | Ht 66.25 in | Wt 175.2 lb

## 2016-09-01 DIAGNOSIS — R1319 Other dysphagia: Secondary | ICD-10-CM

## 2016-09-01 DIAGNOSIS — R1310 Dysphagia, unspecified: Secondary | ICD-10-CM

## 2016-09-01 NOTE — Progress Notes (Signed)
Caleb Pace 46 y.o.   Chief Complaint  Patient presents with  . Sore Throat    per patient continues to have pain and unable to swallow  . Abdominal Pain    continues to have pain    HISTORY OF PRESENT ILLNESS: This is a 46 y.o. male complaining of painful difficult swallowing x 2-3 weeks; has been to ED twice; CT A/P and CT neck done; results reviewed; has not been able to see GIMD. Losing weight and unable to swallow solids; has hard time handling his own secretions. Ex-smoker with possible esophageal pathology.  HPI   Prior to Admission medications   Medication Sig Start Date End Date Taking? Authorizing Provider  amLODipine (NORVASC) 5 MG tablet Take 1 tablet (5 mg total) by mouth daily. 08/18/16  Yes Bathsheba Durrett, Eilleen Kempf, MD  omeprazole (PRILOSEC) 40 MG capsule Take 1 capsule (40 mg total) by mouth daily. 08/23/16  Yes Sunnie Nielsen, DO  ranitidine (ZANTAC) 300 MG tablet Take 1 tablet (300 mg total) by mouth at bedtime. 08/23/16  Yes Sunnie Nielsen, DO  clindamycin (CLEOCIN) 300 MG capsule Take 1 capsule (300 mg total) by mouth 4 (four) times daily. X 7 days Patient not taking: Reported on 09/01/2016 08/17/16   Zadie Rhine, MD    Allergies  Allergen Reactions  . Asa [Aspirin]     Upset stomach   . Lactose Intolerance (Gi)     Upset stomach     Patient Active Problem List   Diagnosis Date Noted  . Tonsillopharyngitis 08/18/2016  . Chronic heartburn 08/18/2016  . Essential hypertension 06/19/2016  . Psoriasis 01/30/2016  . Other dysphagia 12/22/2012  . GERD (gastroesophageal reflux disease) 04/06/2012  . History of Helicobacter pylori infection 03/16/2012  . Dyspepsia 03/16/2012    Past Medical History:  Diagnosis Date  . GERD (gastroesophageal reflux disease)   . Helicobacter pylori gastritis   . Hypertension     Past Surgical History:  Procedure Laterality Date  . ESOPHAGOGASTRODUODENOSCOPY N/A 12/22/2012   Procedure:  ESOPHAGOGASTRODUODENOSCOPY (EGD);  Surgeon: Meryl Dare, MD;  [facility];  Service: Endoscopy;  Laterality: N/A;    Social History   Social History  . Marital status: Married    Spouse name: N/A  . Number of children: 0  . Years of education: N/A   Occupational History  . PAINTER    Social History Main Topics  . Smoking status: Former Smoker    Packs/day: 0.25    Years: 10.00    Types: Cigarettes  . Smokeless tobacco: Never Used  . Alcohol use 0.0 oz/week    5 - 6 Cans of beer per week  . Drug use: No  . Sexual activity: Not on file   Other Topics Concern  . Not on file   Social History Narrative  . No narrative on file    No family history on file.   Review of Systems  Constitutional: Positive for weight loss. Negative for chills and fever.  HENT: Positive for sore throat.   Eyes: Negative.  Negative for discharge and redness.  Respiratory: Negative.  Negative for cough, shortness of breath and stridor.   Cardiovascular: Negative.  Negative for chest pain and palpitations.  Gastrointestinal: Positive for abdominal pain and nausea. Negative for blood in stool, constipation, diarrhea, heartburn, melena and vomiting.  Genitourinary: Negative for dysuria and hematuria.  Skin: Negative for rash.  Neurological: Negative for dizziness and headaches.  Endo/Heme/Allergies: Negative.   All other systems reviewed and are negative.  Vitals:   09/01/16 1557  BP: 126/85  Pulse: 71  Resp: 16  Temp: 98.2 F (36.8 C)    Physical Exam  Constitutional: He is oriented to person, place, and time. He appears well-developed and well-nourished.  HENT:  Head: Normocephalic and atraumatic.  Mouth/Throat: Uvula is midline and mucous membranes are normal. Posterior oropharyngeal erythema present. No oropharyngeal exudate.  Eyes: Pupils are equal, round, and reactive to light. Conjunctivae and EOM are normal.  Neck: Normal range of motion. Neck supple.   Cardiovascular: Normal rate, regular rhythm, normal heart sounds and intact distal pulses.   Pulmonary/Chest: Effort normal and breath sounds normal.  Abdominal: Soft. Bowel sounds are normal. He exhibits no distension. There is no tenderness.  Musculoskeletal: Normal range of motion.  Neurological: He is alert and oriented to person, place, and time. No sensory deficit. He exhibits normal muscle tone.  Skin: Skin is warm and dry. Capillary refill takes less than 2 seconds. No rash noted.  Vitals reviewed.    ASSESSMENT & PLAN: Advised to return to ED for further evaluation; possible admission with GI consult.  Caleb Pace was seen today for sore throat and abdominal pain.  Diagnoses and all orders for this visit:  Esophageal dysphagia Comments: suspected malignancy Orders: -     Ambulatory referral to Gastroenterology    Patient Instructions       IF you received an x-ray today, you will receive an invoice from [facility]. Please contact [facility] at [phone] with questions or concerns regarding your invoice.   IF you received labwork today, you will receive an invoice from [facility]. Please contact [facility] at [phone] with questions or concerns regarding your invoice.   Our billing staff will not be able to assist you with questions regarding bills from these companies.  You will be contacted with the lab results as soon as they are available. The fastest way to get your results is to activate your My Chart account. Instructions are located on the last page of this paperwork. If you have not heard from us regarding the results in 2 weeks, please contact this office.    Disfagia (Dysphagia) La dificultad para tragar (disfagia) ocurre cuando los slidos y los lquidos parecen adherirse a Administratorla garganta en su paso hacia el [place], o los alimentos demoran mucho en llegar al Teachers Insurance and Annuity Associationestmago. Otros sntomas son regurgitacin de la comida, ruidos que provienen  de la garganta, molestias en el pecho al tragar y sensacin de plenitud o de que hay algo adherido en la garganta al tragar. Cuando la obstruccin en la garganta es completa puede asociarse a babeo. CAUSAS Los problemas para tragar pueden ocurrir debido a Estée Lauderproblemas en los msculos. Los alimentos no pueden ser Reynolds Americanempujados de la manera habitual hacia el [place]ago. Puede tener lceras, tejido cicatrizal o inflamacin en el tubo por el que los alimentos descienden desde la boca al estmago (esfago), lo que obstruye a los alimentos en su paso normal hacia el [place]gestmago. Las causas de la inflamacin incluyen:  Reflujo cido desde el estmago hacia el esfago.  Infeccin.  Tratamiento de radiacin para Management consultantel cncer.  Medicamentos que se toman sin la cantidad suficiente de lquido para Hydrologistempujarlos hacia el estmago. Puede ser que tenga problemas neurolgicos que impidan que las seales se enven a los msculos del esfago para que se contraigan y [place] la comida [place]acia abajo, [place]acia el estmago. Un globo farngeo es un problema relativamente frecuente en el que hay una sensacin de obstruccin o dificultad para tragar,  sin que haya ninguna anormalidad fsica en los conductos que intervienen en la deglucin. Este problema generalmente mejora con el tiempo, al reasegurar y [place] estudios para descartar otras causas. DIAGNSTICO La disfagia puede diagnosticarse y su causa puede determinarse con estudios en los que deber tragar una sustancia blanca que ayuda visualizar el interior de la garganta (medio de contraste) mientras se toman radiografas. En algunos casos se inserta un telescopio flexible (endoscopio) para observar el esfago y Investment banker, corporate. TRATAMIENTO  Si la causa de la disfagia es el reflujo cido o una infeccin podrn indicarle medicamentos.  Si la causa de la disfagia son problemas en los msculos que intervienen en la deglucin, ser necesario seguir una terapia para fortalecer estos msculos.  Si  la causa es una obstruccin o un bulto, se realizarn procedimientos para remover la obstruccin.  INSTRUCCIONES PARA EL CUIDADO EN EL HOGAR  Trate de consumir alimentos que sean fciles de tragar y trate de controlar su peso diariamente para asegurarse de que no baje demasiado.  Asegrese de beber lquidos mientras se encuentre sentado erguido (no acostado).  SOLICITE ATENCIN MDICA SI:  Pierde peso debido a que no puede tragar.  Tose al beber lquidos (aspiracin).  Tose y elimina comida parcialmente digerida.  SOLICITE ATENCIN MDICA DE INMEDIATO SI:  No puede tragar su propia saliva.  Tiene dificultad para respirar, tiene fiebre o ambos.  Tiene ronquera junto a la dificultad para tragar.  ASEGRESE DE QUE:  Comprende estas instrucciones.  Controlar su afeccin.  Recibir ayuda de inmediato si no mejora o si empeora.  Esta informacin no tiene Theme park manager el consejo del mdico. Asegrese de hacerle al mdico cualquier pregunta que tenga. Document Released: 10/23/2004 Document Revised: 09/15/2012 Document Reviewed: 07/02/2012 Elsevier Interactive Patient Education  2017 Elsevier Inc.    Edwina Barth, MD [facility]

## 2016-09-01 NOTE — Patient Instructions (Addendum)
IF you received an x-ray today, you will receive an invoice from [facility]. Please contact [facility] at [phone] with questions or concerns regarding your invoice.   IF you received labwork today, you will receive an invoice from [place]. Please contact [facility] at [phone] with questions or concerns regarding your invoice.   Our billing staff will not be able to assist you with questions regarding bills from these companies.  You will be contacted with the lab results as soon as they are available. The fastest way to get your results is to activate your My Chart account. Instructions are located on the last page of this paperwork. If you have not heard from [place] regarding the results in 2 weeks, please contact this office.    Disfagia (Dysphagia) La dificultad para tragar (disfagia) ocurre cuando los slidos y los lquidos parecen adherirse a Administrator en su paso hacia el [place], o los alimentos demoran mucho en llegar al [employer]. Otros sntomas son regurgitacin de la comida, ruidos que provienen de la garganta, molestias en el pecho al tragar y sensacin de plenitud o de que hay algo adherido en la garganta al tragar. Cuando la obstruccin en la garganta es completa puede asociarse a babeo. CAUSAS Los problemas para tragar pueden ocurrir debido a [employer]. Los alimentos no pueden ser [employer] de la manera habitual hacia el [place]. Puede tener lceras, tejido cicatrizal o inflamacin en el tubo por el que los alimentos descienden desde la boca al estmago (esfago), lo que obstruye a los alimentos en su paso normal hacia el [place]. Las causas de la inflamacin incluyen:  Reflujo cido desde el estmago hacia el esfago.  Infeccin.  Tratamiento de radiacin para Management consultant.  Medicamentos que se toman sin la cantidad suficiente de lquido para Hydrologist. Puede ser que tenga problemas neurolgicos que impidan que  las seales se enven a los msculos del esfago para que se contraigan y [place] la comida [place], [place]. Un globo farngeo es un problema relativamente frecuente en el que hay una sensacin de obstruccin o dificultad para tragar, sin que haya ninguna anormalidad fsica en los conductos que intervienen en la deglucin. Este problema generalmente mejora con el tiempo, al reasegurar y [place] estudios para descartar otras causas. DIAGNSTICO La disfagia puede diagnosticarse y su causa puede determinarse con estudios en los que deber tragar una sustancia blanca que ayuda visualizar el interior de la garganta (medio de contraste) mientras se toman radiografas. En algunos casos se inserta un telescopio flexible (endoscopio) para observar el esfago y Investment banker, corporate. TRATAMIENTO  Si la causa de la disfagia es el reflujo cido o una infeccin podrn indicarle medicamentos.  Si la causa de la disfagia son problemas en los msculos que intervienen en la deglucin, ser necesario seguir una terapia para fortalecer estos msculos.  Si la causa es una obstruccin o un bulto, se realizarn procedimientos para remover la obstruccin.  INSTRUCCIONES PARA EL CUIDADO EN EL HOGAR  Trate de consumir alimentos que sean fciles de tragar y trate de controlar su peso diariamente para asegurarse de que no baje demasiado.  Asegrese de beber lquidos mientras se encuentre sentado erguido (no acostado).  SOLICITE ATENCIN MDICA SI:  Pierde peso debido a que no puede tragar.  Tose al beber lquidos (aspiracin).  Tose y elimina comida parcialmente digerida.  SOLICITE ATENCIN MDICA DE INMEDIATO SI:  No puede tragar su propia saliva.  Tiene dificultad para respirar, tiene  fiebre o ambos.  Tiene ronquera junto a la dificultad para tragar.  ASEGRESE DE QUE:  Comprende estas instrucciones.  Controlar su afeccin.  Recibir ayuda de inmediato si no mejora o si empeora.  Esta  informacin no tiene Theme park managercomo fin reemplazar el consejo del mdico. Asegrese de hacerle al mdico cualquier pregunta que tenga. Document Released: 10/23/2004 Document Revised: 09/15/2012 Document Reviewed: 07/02/2012 Elsevier Interactive Patient Education  2017 [employer]

## 2016-09-02 ENCOUNTER — Emergency Department (HOSPITAL_COMMUNITY)
Admission: EM | Admit: 2016-09-02 | Discharge: 2016-09-02 | Disposition: A | Discharge status: Home / Self Care | Payer: Self-pay | Attending: Emergency Medicine | Admitting: Emergency Medicine

## 2016-09-02 ENCOUNTER — Emergency Department (HOSPITAL_COMMUNITY): Payer: Self-pay

## 2016-09-02 ENCOUNTER — Encounter (HOSPITAL_COMMUNITY): Payer: Self-pay | Admitting: *Deleted

## 2016-09-02 DIAGNOSIS — E876 Hypokalemia: Secondary | ICD-10-CM | POA: Insufficient documentation

## 2016-09-02 DIAGNOSIS — Z6828 Body mass index (BMI) 28.0-28.9, adult: Secondary | ICD-10-CM | POA: Insufficient documentation

## 2016-09-02 DIAGNOSIS — R1310 Dysphagia, unspecified: Secondary | ICD-10-CM | POA: Insufficient documentation

## 2016-09-02 DIAGNOSIS — K210 Gastro-esophageal reflux disease with esophagitis: Secondary | ICD-10-CM | POA: Insufficient documentation

## 2016-09-02 DIAGNOSIS — Z8619 Personal history of other infectious and parasitic diseases: Secondary | ICD-10-CM | POA: Insufficient documentation

## 2016-09-02 DIAGNOSIS — Z79899 Other long term (current) drug therapy: Secondary | ICD-10-CM | POA: Insufficient documentation

## 2016-09-02 DIAGNOSIS — Z87891 Personal history of nicotine dependence: Secondary | ICD-10-CM | POA: Insufficient documentation

## 2016-09-02 DIAGNOSIS — I10 Essential (primary) hypertension: Secondary | ICD-10-CM | POA: Insufficient documentation

## 2016-09-02 DIAGNOSIS — R197 Diarrhea, unspecified: Secondary | ICD-10-CM | POA: Insufficient documentation

## 2016-09-02 DIAGNOSIS — K4090 Unilateral inguinal hernia, without obstruction or gangrene, not specified as recurrent: Secondary | ICD-10-CM | POA: Insufficient documentation

## 2016-09-02 DIAGNOSIS — R634 Abnormal weight loss: Secondary | ICD-10-CM | POA: Insufficient documentation

## 2016-09-02 DIAGNOSIS — R1011 Right upper quadrant pain: Secondary | ICD-10-CM | POA: Insufficient documentation

## 2016-09-02 LAB — CBC WITH DIFFERENTIAL/PLATELET
BASOS PCT: 0 %
Basophils Absolute: 0 10*3/uL (ref 0.0–0.1)
EOS ABS: 0.2 10*3/uL (ref 0.0–0.7)
EOS PCT: 4 %
HCT: 43.9 % (ref 39.0–52.0)
Hemoglobin: 15.3 g/dL (ref 13.0–17.0)
LYMPHS ABS: 1.6 10*3/uL (ref 0.7–4.0)
Lymphocytes Relative: 37 %
MCH: 32.8 pg (ref 26.0–34.0)
MCHC: 34.9 g/dL (ref 30.0–36.0)
MCV: 94 fL (ref 78.0–100.0)
MONOS PCT: 5 %
Monocytes Absolute: 0.2 10*3/uL (ref 0.1–1.0)
Neutro Abs: 2.4 10*3/uL (ref 1.7–7.7)
Neutrophils Relative %: 54 %
PLATELETS: 174 10*3/uL (ref 150–400)
RBC: 4.67 MIL/uL (ref 4.22–5.81)
RDW: 13 % (ref 11.5–15.5)
WBC: 4.5 10*3/uL (ref 4.0–10.5)

## 2016-09-02 LAB — URINALYSIS, ROUTINE W REFLEX MICROSCOPIC
Bilirubin Urine: NEGATIVE
Glucose, UA: NEGATIVE mg/dL
HGB URINE DIPSTICK: NEGATIVE
Ketones, ur: NEGATIVE mg/dL
Leukocytes, UA: NEGATIVE
NITRITE: NEGATIVE
PROTEIN: NEGATIVE mg/dL
SPECIFIC GRAVITY, URINE: 1.006 (ref 1.005–1.030)
pH: 8 (ref 5.0–8.0)

## 2016-09-02 LAB — BASIC METABOLIC PANEL
Anion gap: 7 (ref 5–15)
BUN: 7 mg/dL (ref 6–20)
CALCIUM: 9.1 mg/dL (ref 8.9–10.3)
CHLORIDE: 109 mmol/L (ref 101–111)
CO2: 25 mmol/L (ref 22–32)
CREATININE: 0.54 mg/dL — AB (ref 0.61–1.24)
GFR calc non Af Amer: >60 mL/min (ref >60)
Glucose, Bld: 105 mg/dL — ABNORMAL HIGH (ref 65–99)
Potassium: 3.8 mmol/L (ref 3.5–5.1)
SODIUM: 141 mmol/L (ref 135–145)

## 2016-09-02 LAB — I-STAT TROPONIN, ED: Troponin i, poc: 0 ng/mL (ref 0.00–0.08)

## 2016-09-02 LAB — CBC
HEMATOCRIT: 42.1 % (ref 39.0–52.0)
HEMOGLOBIN: 14.8 g/dL (ref 13.0–17.0)
MCH: 32.7 pg (ref 26.0–34.0)
MCHC: 35.2 g/dL (ref 30.0–36.0)
MCV: 93.1 fL (ref 78.0–100.0)
Platelets: 161 10*3/uL (ref 150–400)
RBC: 4.52 MIL/uL (ref 4.22–5.81)
RDW: 12.9 % (ref 11.5–15.5)
WBC: 4.3 10*3/uL (ref 4.0–10.5)

## 2016-09-02 NOTE — ED Triage Notes (Signed)
Pt c/o trouble swallowing food, has seen GI doctor and is awaiting an endoscopy appointment but he hasn't been able to eat for 3 days. Is tolerating fluids. In control of oral secretions, no signs of distress.

## 2016-09-02 NOTE — ED Triage Notes (Signed)
[facility], was discharged with a GI referral. Once pt was home started having chest pain and LUQ pain. Pt reports dysphagia for "a while" and has been told he needed a endoscopy, has not been able to follow up yet. EMS gave one nitro without relief and no ASA due to difficulty swallowing

## 2016-09-02 NOTE — ED Provider Notes (Signed)
[facility]-EMERGENCY DEPT Provider Note   CSN: [id number] Arrival date & time: 09/02/16  1227     History   Chief Complaint Chief Complaint  Patient presents with  . Dysphagia    HPI Caleb Pace is a 46 y.o. male with past medical history of GERD, hypertension who presents with 3 days of progressively worsening swelling. Patient reports that for the last 3 days he has had More difficulty difficulty tolerating solids by mouth secondary to symptoms. He reports that he is able to swallow and he does not have any vomiting. He reports increase in saliva but is not actively drooling. He has been able to swallow liquids and his secretions but with worsening pain. This is been a long-standing problem that patient has had for several years dating back to 2014. He has recently been in the emergency department 2 times the last month for similar complaints. He had a CT of the neck done that showed no evidence of abscess but did so tonsillopharyngitis. He was treated with clindamycin. He is followed up with his primary care doctor and was given an ambulatory referral to GI which she has not been able to follow up with yet. Patient was seen at [facility] last night for same symptoms. His workup was unremarkable and is discharged home with follow-up. Patient comes in to the emergency department today because he knows that he needs a endoscopy but has not been able to get it done. He reports he has lost probably 6 pounds the last several days secondary to not being able to eat and swallow. Patient denies any fevers, chest pain, difficulty breathing, abdominal pain.   The history is provided by the patient.    Past Medical History:  Diagnosis Date  . GERD (gastroesophageal reflux disease)   . Helicobacter pylori gastritis   . Hypertension     Patient Active Problem List   Diagnosis Date Noted  . Tonsillopharyngitis 08/18/2016  . Chronic heartburn 08/18/2016  . Essential hypertension  06/19/2016  . Psoriasis 01/30/2016  . Other dysphagia 12/22/2012  . GERD (gastroesophageal reflux disease) 04/06/2012  . History of Helicobacter pylori infection 03/16/2012  . Dyspepsia 03/16/2012    Past Surgical History:  Procedure Laterality Date  . ESOPHAGOGASTRODUODENOSCOPY N/A 12/22/2012   Procedure: ESOPHAGOGASTRODUODENOSCOPY (EGD);  Surgeon: Meryl Dare, MD;  Location: [facility];  Service: Endoscopy;  Laterality: N/A;       Home Medications    Prior to Admission medications   Medication Sig Start Date End Date Taking? Authorizing Provider  amLODipine (NORVASC) 5 MG tablet Take 1 tablet (5 mg total) by mouth daily. 08/18/16  Yes Sagardia, Eilleen Kempf, MD  omeprazole (PRILOSEC) 40 MG capsule Take 1 capsule (40 mg total) by mouth daily. 08/23/16  Yes Sunnie Nielsen, DO  clindamycin (CLEOCIN) 300 MG capsule Take 1 capsule (300 mg total) by mouth 4 (four) times daily. X 7 days Patient not taking: Reported on 09/02/2016 08/17/16   Zadie Rhine, MD  ranitidine (ZANTAC) 300 MG tablet Take 1 tablet (300 mg total) by mouth at bedtime. Patient not taking: Reported on 09/02/2016 08/23/16   Sunnie Nielsen, DO    Family History No family history on file.  Social History Social History  Substance Use Topics  . Smoking status: Former Smoker    Packs/day: 0.25    Years: 10.00    Types: Cigarettes  . Smokeless tobacco: Never Used  . Alcohol use 0.0 oz/week    5 - 6 Cans of beer per  week     Allergies   Asa [aspirin]; Lactose intolerance (gi); and Penicillins   Review of Systems Review of Systems  Constitutional: Negative for fever.  HENT: Positive for trouble swallowing. Negative for drooling.   Respiratory: Negative for cough and shortness of breath.   Cardiovascular: Negative for chest pain.  Gastrointestinal: Negative for abdominal pain, nausea and vomiting.  Genitourinary: Negative for dysuria and hematuria.     Physical Exam Updated Vital Signs BP  (!) 175/112 (BP Location: Left Arm)   Pulse 65   Temp 97.9 F (36.6 C) (Oral)   Resp 18   SpO2 99%   Physical Exam  Constitutional: He is oriented to person, place, and time. He appears well-developed and well-nourished.  Sitting comfortably on examination table  HENT:  Head: Normocephalic and atraumatic.  Mouth/Throat: Uvula is midline and mucous membranes are normal. No trismus in the jaw. Posterior oropharyngeal erythema present.  No drooling. He is able to tolerate his secretions. Mild erythema to the posterior oropharynx. Uvula is midline. No evidence of peritonsillar abscess. Airway is patent. No facial swelling.   Eyes: Pupils are equal, round, and reactive to light. Conjunctivae, EOM and lids are normal.  Neck: Full passive range of motion without pain.  No neck swelling  Cardiovascular: Normal rate, regular rhythm, normal heart sounds and normal pulses.  Exam reveals no gallop and no friction rub.   No murmur heard. Pulmonary/Chest: Effort normal and breath sounds normal.  No evidence of respiratory distress. Able to speak in full sentences without difficulty.  Abdominal: Soft. Normal appearance. There is no tenderness. There is no rigidity and no guarding.  Musculoskeletal: Normal range of motion.  Neurological: He is alert and oriented to person, place, and time.  Skin: Skin is warm and dry. Capillary refill takes less than 2 seconds.  Psychiatric: He has a normal mood and affect. His speech is normal.  Nursing note and vitals reviewed.    ED Treatments / Results  Labs (all labs ordered are listed, but only abnormal results are displayed) Labs Reviewed - No data to display  EKG  EKG Interpretation None       Radiology No results found.  Procedures Procedures (including critical care time)  Medications Ordered in ED Medications - No data to display   Initial Impression / Assessment and Plan / ED Course  I have reviewed the triage vital signs and the  nursing notes.  Pertinent labs & imaging results that were available during my care of the patient were reviewed by me and considered in my medical decision making (see chart for details).     46 year old male who presents with persistent dysphagia that his been ongoing for several months. He reports that in the last 3-4 days he has had more difficulty swallowing solid foods. He is still able to tolerate liquids and tolerate his secretions. He has been seen in the ED for same symptoms, most recently on 08/26/16. Had CT and U/S evaluation of his neck that were remarkable to tonsillitis but otherwise negative. He has been seen by his primary care doctor is been able to referral to GI for EGD but has not been able to get in with them. [facility] for same symptoms and was discharged earlier this morning. Patient comes to the ED tonight because he cannot get into GI and has had worsening difficulty swallowing of solid foods. Patient is afebrile, non-toxic appearing, sitting comfortably on examination table. Vital  signs reviewed and stable. No clinical signs of dehydration on physical exam. History/physical exam are not concerning for a food impaction. Plan to check basic labs including CBC, BMP, UA. Will plan to PO challenge in the department.  Labs reviewed. UA is unremarkable. BMP with no clinical signs of dehydration. CBC unremarkable. Discussed results with patient. PO challenged patient in the department. He is able to swallow several drinks of water while in the room, though he does report some pain when doing so. No evidence of vomiting. Will plan to consult GI for further recommendation. Suspect that patient will need an EGD, but that GI will most likely perform it on outpatient basis. Discussed patient with Dr. Erma Heritage.   8:58 PM: Patient able to tolerate PO in the department.   Discussed with Dr. Audley Hose (on call for GI). Records reviewed that patient had  previously been evaluated by Dr. Rhea Belton ([facility]) in 2014. Recommends contacting [facility] for further recommendation.   Discussed with Dr. Myrtie Neither ([facility]). Given patient's ability to tolerate PO and lab work, recommends arranging for an outpatient GI appointment with possible EGD at the end of this week. Given patient's overall well appearance, I feel this is reasonable.   Discussed plan with patient. Explained that [facility] will contact him for an appointment. Instructed him to continue taking his PPI. Instructed patient to follow-up with PCP in 2 days. Strict eturn precautions discussed. Patient expresses understanding and agreement to plan.     Final Clinical Impressions(s) / ED Diagnoses   Final diagnoses:  Dysphagia, unspecified type    New Prescriptions New Prescriptions   No medications on file     Rosana Hoes 09/04/16 2200    Shaune Pollack, MD 09/05/16 [phone]

## 2016-09-02 NOTE — Discharge Instructions (Signed)
Follow-up with referred GI doctor. They will be calling you by the end of the week. If he had not heard from them, call the office to arrange for an appointment.   Follow-up with her primary care doctor in 24-48 hours for further evaluation.  Return the emergency Department for worsening difficulty swelling, vomiting, inability to swallow liquids, drooling, chest pain difficulty breathing or any other worsening or concerning symptoms.

## 2016-09-03 ENCOUNTER — Observation Stay (HOSPITAL_COMMUNITY)
Admission: EM | Admit: 2016-09-03 | Discharge: 2016-09-04 | Disposition: A | Discharge status: Home / Self Care | Payer: Self-pay | Attending: Internal Medicine | Admitting: Internal Medicine

## 2016-09-03 ENCOUNTER — Emergency Department (HOSPITAL_COMMUNITY): Payer: Self-pay

## 2016-09-03 ENCOUNTER — Observation Stay (HOSPITAL_COMMUNITY): Payer: Self-pay

## 2016-09-03 ENCOUNTER — Encounter (HOSPITAL_COMMUNITY): Payer: Self-pay | Admitting: Radiology

## 2016-09-03 DIAGNOSIS — K209 Esophagitis, unspecified without bleeding: Secondary | ICD-10-CM

## 2016-09-03 DIAGNOSIS — R1011 Right upper quadrant pain: Secondary | ICD-10-CM

## 2016-09-03 DIAGNOSIS — R1312 Dysphagia, oropharyngeal phase: Secondary | ICD-10-CM

## 2016-09-03 DIAGNOSIS — R197 Diarrhea, unspecified: Secondary | ICD-10-CM

## 2016-09-03 DIAGNOSIS — E876 Hypokalemia: Secondary | ICD-10-CM | POA: Insufficient documentation

## 2016-09-03 DIAGNOSIS — R1310 Dysphagia, unspecified: Secondary | ICD-10-CM

## 2016-09-03 DIAGNOSIS — K210 Gastro-esophageal reflux disease with esophagitis: Secondary | ICD-10-CM

## 2016-09-03 DIAGNOSIS — K4090 Unilateral inguinal hernia, without obstruction or gangrene, not specified as recurrent: Secondary | ICD-10-CM

## 2016-09-03 DIAGNOSIS — I10 Essential (primary) hypertension: Secondary | ICD-10-CM | POA: Diagnosis present

## 2016-09-03 DIAGNOSIS — K219 Gastro-esophageal reflux disease without esophagitis: Secondary | ICD-10-CM | POA: Diagnosis present

## 2016-09-03 HISTORY — DX: Unilateral inguinal hernia, without obstruction or gangrene, not specified as recurrent: K40.90

## 2016-09-03 LAB — HEPATIC FUNCTION PANEL
ALBUMIN: 3.8 g/dL (ref 3.5–5.0)
ALT: 42 U/L (ref 17–63)
AST: 31 U/L (ref 15–41)
Alkaline Phosphatase: 54 U/L (ref 38–126)
BILIRUBIN DIRECT: 0.3 mg/dL (ref 0.1–0.5)
Indirect Bilirubin: 1 mg/dL — ABNORMAL HIGH (ref 0.3–0.9)
TOTAL PROTEIN: 6.4 g/dL — AB (ref 6.5–8.1)
Total Bilirubin: 1.3 mg/dL — ABNORMAL HIGH (ref 0.3–1.2)

## 2016-09-03 LAB — LIPASE, BLOOD: LIPASE: 38 U/L (ref 11–51)

## 2016-09-03 LAB — BASIC METABOLIC PANEL
Anion gap: 6 (ref 5–15)
BUN: 6 mg/dL (ref 6–20)
CO2: 23 mmol/L (ref 22–32)
Calcium: 9 mg/dL (ref 8.9–10.3)
Chloride: 108 mmol/L (ref 101–111)
Creatinine, Ser: 0.55 mg/dL — ABNORMAL LOW (ref 0.61–1.24)
GFR calc Af Amer: >60 mL/min (ref >60)
GFR calc non Af Amer: >60 mL/min (ref >60)
GLUCOSE: 108 mg/dL — AB (ref 65–99)
POTASSIUM: 3.3 mmol/L — AB (ref 3.5–5.1)
Sodium: 137 mmol/L (ref 135–145)

## 2016-09-03 LAB — I-STAT TROPONIN, ED: Troponin i, poc: 0 ng/mL (ref 0.00–0.08)

## 2016-09-03 MED ORDER — ACETAMINOPHEN 325 MG PO TABS: 650.0000 mg | ORAL_TABLET | Freq: Four times a day (QID) | ORAL | Status: DC | PRN

## 2016-09-03 MED ORDER — AMLODIPINE BESYLATE 5 MG PO TABS
5.0000 mg | ORAL_TABLET | Freq: Every day | ORAL | Status: DC
  Administered 2016-09-04: 5 mg via ORAL
  Filled 2016-09-03: qty 1

## 2016-09-03 MED ORDER — IOPAMIDOL (ISOVUE-300) INJECTION 61%
INTRAVENOUS | Status: AC
  Administered 2016-09-03: 100 mL
  Filled 2016-09-03: qty 100

## 2016-09-03 MED ORDER — ACETAMINOPHEN 650 MG RE SUPP: 650.0000 mg | Freq: Four times a day (QID) | RECTAL | Status: DC | PRN

## 2016-09-03 MED ORDER — POTASSIUM CHLORIDE 10 MEQ/100ML IV SOLN
10.0000 meq | INTRAVENOUS | Status: DC
Start: 2016-09-03 — End: 2016-09-03
  Administered 2016-09-03 – 2016-09-04 (×3): 10 meq via INTRAVENOUS
  Filled 2016-09-03 (×3): qty 100

## 2016-09-03 MED ORDER — PANTOPRAZOLE SODIUM 40 MG IV SOLR
40.0000 mg | Freq: Once | INTRAVENOUS | Status: AC
  Administered 2016-09-03: 40 mg via INTRAVENOUS
  Filled 2016-09-03: qty 40

## 2016-09-03 MED ORDER — IOPAMIDOL (ISOVUE-300) INJECTION 61%
INTRAVENOUS | Status: AC
  Filled 2016-09-03: qty 30

## 2016-09-03 MED ORDER — HYDRALAZINE HCL 20 MG/ML IJ SOLN
5.0000 mg | INTRAMUSCULAR | Status: DC | PRN
Start: 2016-09-03 — End: 2016-09-04

## 2016-09-03 MED ORDER — IOPAMIDOL (ISOVUE-300) INJECTION 61%
INTRAVENOUS | Status: AC
  Administered 2016-09-03: 75 mL
  Filled 2016-09-03: qty 75

## 2016-09-03 MED ORDER — GI COCKTAIL ~~LOC~~: 30.0000 mL | Freq: Three times a day (TID) | ORAL | Status: DC | PRN

## 2016-09-03 MED ORDER — SODIUM CHLORIDE 0.9 % IV SOLN
INTRAVENOUS | Status: DC
Start: 2016-09-03 — End: 2016-09-03
  Administered 2016-09-03: 100 mL/h via INTRAVENOUS

## 2016-09-03 MED ORDER — ONDANSETRON HCL 4 MG/2ML IJ SOLN: 4.0000 mg | Freq: Four times a day (QID) | INTRAMUSCULAR | Status: DC | PRN

## 2016-09-03 MED ORDER — FAMOTIDINE IN NACL 20-0.9 MG/50ML-% IV SOLN
20.0000 mg | INTRAVENOUS | Status: DC
  Administered 2016-09-03: 20 mg via INTRAVENOUS
  Filled 2016-09-03 (×2): qty 50

## 2016-09-03 MED ORDER — ONDANSETRON HCL 4 MG PO TABS: 4.0000 mg | ORAL_TABLET | Freq: Four times a day (QID) | ORAL | Status: DC | PRN

## 2016-09-03 MED ORDER — MORPHINE SULFATE (PF) 4 MG/ML IV SOLN: 4.0000 mg | INTRAVENOUS | Status: DC | PRN

## 2016-09-03 MED ORDER — PANTOPRAZOLE SODIUM 40 MG IV SOLR
40.0000 mg | Freq: Two times a day (BID) | INTRAVENOUS | Status: DC
  Administered 2016-09-04 (×2): 40 mg via INTRAVENOUS
  Filled 2016-09-03 (×2): qty 40

## 2016-09-03 MED ORDER — SODIUM CHLORIDE 0.9 % IV SOLN
INTRAVENOUS | Status: DC
  Administered 2016-09-03 – 2016-09-04 (×2): via INTRAVENOUS

## 2016-09-03 MED ORDER — POTASSIUM CHLORIDE IN NACL 20-0.9 MEQ/L-% IV SOLN: INTRAVENOUS | Status: DC

## 2016-09-03 NOTE — ED Notes (Signed)
Discussed with CT about having a Neck CT WI contrast ordered; they recommend With contrast, but the pt got contrast earlier today.  They recommend a half-dose of the CT contrast.  Attempting to contact inpatient MD for this order but unable to find who took over for evening shift.

## 2016-09-03 NOTE — ED Notes (Signed)
Pt returned from CT °

## 2016-09-03 NOTE — Consult Note (Signed)
Caleb Pace 46 y.o. 10-27-1970 [id number]  Assessment & Plan:  1. Dysphagia- rapidly worsening 2. Odynophagia 3. RUQ pain x1 year 4. New onset Diarrhea   ED ordered CT scan- will review results  Schedule EGD with Dr. Carlean Purl  Consider RUQ ultrasound pending CT results  Caleb Pace has a constellation of new and chronic symptoms that are somewhat worrisome for malignancy.  Pending testing results will do further work up.  CT scab was reassuring - LIH only sig abnormality  I have personally seen the patient, reviewed and repeated key elements of the history and physical and participated in formation of the assessment and plan the student has documented. The risks and benefits as well as alternatives of endoscopic procedure(s) have been discussed and reviewed. All questions answered. The patient agrees to proceed.   Gatha Mayer, MD, [facility] [phone] (pager) 09/03/2016 6:42 PM    Subjective:   Chief Complaint: Dysphagia RUQ pain radiating to back HPI Caleb Pace is pleasant 46 yo Hispanic gentleman with a past medical history significant for GERD, H. Pylori (fully treated).  We are seeing him today for consult of increasing dysphagia and odynophagia.  He says he has had some trouble swallowing for about 2 months, but in the last 3 weeks it has progressively gotten worse.  He has to chew his foods into very small bites and drink water to get the food to go down.  Within the last 2 weeks he has been experiencing early satiety and 6lb weight loss in the last week.  He often experiences a sore throat and globus sensation when he eats and says " I'm afraid to swallow sometimes" He has had increase in reflux and excessive salivation with mild nausea over the last 3 weeks. Recent new onset diarrhea in the last week.  Denies vomiting, constipation, fever, chills, night sweats. Denies hematochezia, melena.  RUQ pain has been chronic over the last  year. It has been gradually getting worse. He states it is sharp, burning and intermittent and often radiates to his back.  He feels like burping relives the pain.  He has not noticed any change in the pain with specific foods.    EGD 03/25/2012 Diagnosis  1. Surgical [P], gastric, bx - CHRONIC FOCALLY ACTIVE GASTRITIS. - POSITIVE FOR H PYLORI COLONIZATION. - NEGATIVE FOR DYSPLASIA AND MALIGNANCY. 2. Surgical [P], GE junction, bx - CHRONICALLY INFLAMED GE JUNCTION MUCOSA. - NEGATIVE FOR BARRETT'S MUCOSA.   Allergies  Allergen Reactions  . Asa [Aspirin]     Upset stomach   . Lactose Intolerance (Gi)     Upset stomach   . Penicillins     Has patient had a PCN reaction causing immediate rash, facial/tongue/throat swelling, SOB or lightheadedness with hypotension: no, anxious  Has patient had a PCN reaction causing severe rash involving mucus membranes or skin necrosis: no Has patient had a PCN reaction that required hospitalization: no Has patient had a PCN reaction occurring within the last 10 years: yes If all of the above answers are "NO", then may proceed with Cephalosporin use.   No outpatient prescriptions have been marked as taking for the 09/03/16 encounter [facility] Encounter).   Past Medical History:  Diagnosis Date  . GERD (gastroesophageal reflux disease)   . Helicobacter pylori gastritis   . Hypertension    Past Surgical History:  Procedure Laterality Date  . ESOPHAGOGASTRODUODENOSCOPY N/A 12/22/2012   Procedure: ESOPHAGOGASTRODUODENOSCOPY (EGD);  Surgeon: Ladene Artist, MD;  Location: [facility];  Service: Endoscopy;  Laterality: N/A;   Social History   Social History  . Marital status: Married    Spouse name: N/A  . Number of children: 0  . Years of education: N/A   Occupational History  . PAINTER    Social History Main Topics  . Smoking status: Former Smoker    Packs/day: 0.25    Years: 10.00    Types: Cigarettes  . Smokeless tobacco: Never Used    . Alcohol use 0.0 oz/week    5 - 6 Cans of beer per week  . Drug use: No  . Sexual activity: Not on file   Other Topics Concern  . Not on file   Social History Narrative  . No narrative on file   family history is not on file.   Review of Systems Constitutional: positive for weight loss ENT: positive for sore throat, globus sensation GI: positive for dysphagia, nausea, abdominal pain, diarrhea  All other systems negative. See HPI for details.   Objective:   Physical Exam _0  134/87   Pulse 70   Temp 98.1 F (36.7 C) (Oral)   Resp 13   SpO2 97% @  General:  Well-developed, well-nourished and in no acute distress Eyes:  Mild icterus, Pupils ERRLA ENT:   Mouth and posterior pharynx free of lesions. Non jaundiced Neck:   supple w/o thyromegaly or mass.  Lungs: Clear to auscultation bilaterally. Heart:   S1S2, no rubs, murmurs, gallops. Abdomen:  soft, no hepatosplenomegaly, hernia, or mass and BS+.  Mild TTP RUQ and LUQ, negative Murphy's Sign.  No guarding or rebound pain Rectal: Not Examined Lymph:  no cervical or supraclavicular adenopathy. Extremities:   no edema, cyanosis or clubbing Skin   no rash. No Jaundice noted Neuro:  A&O x 3.  Psych:  appropriate mood and  Affect.   Data Reviewed:  EGD Pathology from 03/22/2012 Diagnosis 1. Surgical [P], gastric, bx - CHRONIC FOCALLY ACTIVE GASTRITIS. - POSITIVE FOR H PYLORI COLONIZATION. - NEGATIVE FOR DYSPLASIA AND MALIGNANCY. 2. Surgical [P], GE junction, bx - CHRONICALLY INFLAMED GE JUNCTION MUCOSA. - NEGATIVE FOR BARRETT'S MUCOSA.  ER notes Labs AST: 31 ALT:42 Alk Phos: 54 Bili Direct: .3 Indirect Bili: 1.0 Total Bili 1.3  Edward Qualia, PA-S [employer]

## 2016-09-03 NOTE — H&P (Signed)
History and Physical    Caleb Pace ZOX:[id number] DOB: [birth year] DOA: 09/03/2016  PCP: Georgina QuintSagardia, Miguel Jose, MD Patient coming from: Home  Chief Complaint: difficulty and pain w/ swallowing.   HPI: Caleb SavoyBernardo Pace is a 46 y.o. male with medical history significant of GERD, H. pylori gastritis, hypertension.   2 wks history of worsening dysphagia and odynophagia. Problem has been present for several months prior to acute worsening. Difficulty with swallowing solids and liquids. Patient reports a 6 pound weight loss over the last several days. Intermittent improvement with short treatments of oral antibiotics and steroids and PPI. Denies abdominal pain, nausea, chest pain, shortness of breath, palpitations, fevers, dysuria, frequency, melena, hematochezia, hematemesis. Patient does endorse some vomiting of food with attempted eating. The patient reports previous EGD in 2014 which showed H. pylori which was effectively treated. Patient went to [facility] on 09/01/2016 for evaluation of the same problem. At that point time patient was given fluids for last. Acidemia and was scheduled to follow-up with GI within 1 week of time of discharge.  Subjective relief reported with over-the-counter antacids.   ED Course: GI consult recommending admission for EGD. Start on IV fluids and Protonix. Objective findings below.    Review of Systems: As per HPI otherwise all other systems reviewed and are negative  Ambulatory Status:no restrictions  Past Medical History:  Diagnosis Date  . GERD (gastroesophageal reflux disease)   . Helicobacter pylori gastritis   . Hypertension     Past Surgical History:  Procedure Laterality Date  . ESOPHAGOGASTRODUODENOSCOPY N/A 12/22/2012   Procedure: ESOPHAGOGASTRODUODENOSCOPY (EGD);  Surgeon: Meryl DareMalcolm T Stark, MD;  [facility];  Service: Endoscopy;  Laterality: N/A;    Social History   Social History  . Marital status: Married    Spouse name: N/A   . Number of children: 0  . Years of education: N/A   Occupational History  . PAINTER    Social History Main Topics  . Smoking status: Former Smoker    Packs/day: 0.25    Years: 10.00    Types: Cigarettes  . Smokeless tobacco: Never Used  . Alcohol use 0.0 oz/week    5 - 6 Cans of beer per week  . Drug use: No  . Sexual activity: Not on file   Other Topics Concern  . Not on file   Social History Narrative  . No narrative on file    Allergies  Allergen Reactions  . Amoxicillin Other (See Comments)    Anxiety   . Asa [Aspirin]     Upset stomach   . Lactose Intolerance (Gi)     Upset stomach   . Penicillins Other (See Comments)    Anxiety Has patient had a PCN reaction causing immediate rash, facial/tongue/throat swelling, SOB or lightheadedness with hypotension: no, anxious  Has patient had a PCN reaction causing severe rash involving mucus membranes or skin necrosis: no Has patient had a PCN reaction that required hospitalization: no Has patient had a PCN reaction occurring within the last 10 years: yes If all of the above answers are "NO", then may proceed with Cephalosporin use.     Family History  Problem Relation Age of Onset  . Family history unknown: Yes      Prior to Admission medications   Medication Sig Start Date End Date Taking? Authorizing Provider  amLODipine (NORVASC) 5 MG tablet Take 1 tablet (5 mg total) by mouth daily. 08/18/16  Yes Georgina QuintSagardia, Miguel Jose, MD  omeprazole (PRILOSEC) 40 MG capsule  Take 1 capsule (40 mg total) by mouth daily. 08/23/16  Yes Sunnie Nielsen, DO  clindamycin (CLEOCIN) 300 MG capsule Take 1 capsule (300 mg total) by mouth 4 (four) times daily. X 7 days Patient not taking: Reported on 09/02/2016 08/17/16   Zadie Rhine, MD  ranitidine (ZANTAC) 300 MG tablet Take 1 tablet (300 mg total) by mouth at bedtime. Patient not taking: Reported on 09/02/2016 08/23/16   Sunnie Nielsen, DO    Physical Exam: Vitals:   09/03/16  1215 09/03/16 1230 09/03/16 1300 09/03/16 1309  BP: (!) 145/91 (!) 142/93 138/90   Pulse: 64 73 66   Resp: 13 16 15    Temp:      TempSrc:      SpO2: 98% 98% 98%   Weight:    79.4 kg (175 lb)  Height:    5\' 6"  (1.676 m)     General:  Appears calm and comfortable Eyes:  PERRL, EOMI, normal lids, iris ENT:  grossly normal hearing, lips & tongue, mmm Neck:  no LAD, masses or thyromegaly Cardiovascular:  RRR, no m/r/g. No LE edema.  Respiratory:  CTA bilaterally, no w/r/r. Normal respiratory effort. Abdomen:  soft, ntnd, NABS Skin:  no rash or induration seen on limited exam Musculoskeletal:  grossly normal tone BUE/BLE, good ROM, no bony abnormality Psychiatric:  grossly normal mood and affect, speech fluent and appropriate, AOx3 Neurologic:  CN 2-12 grossly intact, moves all extremities in coordinated fashion, sensation intact  Labs on Admission: I have personally reviewed following labs and imaging studies  CBC:  Recent Labs Lab 09/02/16 2007 09/02/16 2327  WBC 4.5 4.3  NEUTROABS 2.4  --   HGB 15.3 14.8  HCT 43.9 42.1  MCV 94.0 93.1  PLT 174 161   Basic Metabolic Panel:  Recent Labs Lab 09/02/16 2007 09/02/16 2327  NA 141 137  K 3.8 3.3*  CL 109 108  CO2 25 23  GLUCOSE 105* 108*  BUN 7 6  CREATININE 0.54* 0.55*  CALCIUM 9.1 9.0   GFR: Estimated Creatinine Clearance: 115.5 mL/min (A) (by C-G formula based on SCr of 0.55 mg/dL (L)). Liver Function Tests:  Recent Labs Lab 09/03/16 0737  AST 31  ALT 42  ALKPHOS 54  BILITOT 1.3*  PROT 6.4*  ALBUMIN 3.8    Recent Labs Lab 09/03/16 0737  LIPASE 38   No results for input(s): AMMONIA in the last 168 hours. Coagulation Profile: No results for input(s): INR, PROTIME in the last 168 hours. Cardiac Enzymes: No results for input(s): CKTOTAL, CKMB, CKMBINDEX, TROPONINI in the last 168 hours. BNP (last 3 results) No results for input(s): PROBNP in the last 8760 hours. HbA1C: No results for input(s):  HGBA1C in the last 72 hours. CBG: No results for input(s): GLUCAP in the last 168 hours. Lipid Profile: No results for input(s): CHOL, HDL, LDLCALC, TRIG, CHOLHDL, LDLDIRECT in the last 72 hours. Thyroid Function Tests: No results for input(s): TSH, T4TOTAL, FREET4, T3FREE, THYROIDAB in the last 72 hours. Anemia Panel: No results for input(s): VITAMINB12, FOLATE, FERRITIN, TIBC, IRON, RETICCTPCT in the last 72 hours. Urine analysis:    Component Value Date/Time   COLORURINE STRAW (A) 09/02/2016 1943   APPEARANCEUR CLEAR 09/02/2016 1943   LABSPEC 1.006 09/02/2016 1943   PHURINE 8.0 09/02/2016 1943   GLUCOSEU NEGATIVE 09/02/2016 1943   HGBUR NEGATIVE 09/02/2016 1943   BILIRUBINUR NEGATIVE 09/02/2016 1943   BILIRUBINUR negative 06/09/2016 1337   KETONESUR NEGATIVE 09/02/2016 1943   PROTEINUR NEGATIVE 09/02/2016 1943  UROBILINOGEN 0.2 06/09/2016 1337   UROBILINOGEN 0.2 09/29/2010 2118   NITRITE NEGATIVE 09/02/2016 1943   LEUKOCYTESUR NEGATIVE 09/02/2016 1943    Creatinine Clearance: Estimated Creatinine Clearance: 115.5 mL/min (A) (by C-G formula based on SCr of 0.55 mg/dL (L)).  Sepsis Labs: @LABRCNTIP (procalcitonin:4,lacticidven:4) )No results found for this or any previous visit (from the past 240 hour(s)).   Radiological Exams on Admission: Dg Chest 2 View  Result Date: 09/02/2016 CLINICAL DATA:  Acute onset of generalized chest pain and left upper quadrant abdominal pain. Dysphagia. Initial encounter. EXAM: CHEST  2 VIEW COMPARISON:  Chest radiograph performed 08/26/2016 FINDINGS: The lungs are well-aerated and clear. There is no evidence of focal opacification, pleural effusion or pneumothorax. The heart is normal in size; the mediastinal contour is within normal limits. No acute osseous abnormalities are seen. IMPRESSION: No acute cardiopulmonary process seen. Electronically Signed   By: Roanna Raider M.D.   On: 09/02/2016 23:45   Ct Abdomen Pelvis W Contrast  Result  Date: 09/03/2016 CLINICAL DATA:  Diffuse upper abdominal pain for 3 days. EXAM: CT ABDOMEN AND PELVIS WITH CONTRAST TECHNIQUE: Multidetector CT imaging of the abdomen and pelvis was performed using the standard protocol following bolus administration of intravenous contrast. CONTRAST:  ISOVUE-300 IOPAMIDOL (ISOVUE-300) INJECTION 61% COMPARISON:  None. FINDINGS: Lower chest: Dependent atelectasis. Hepatobiliary: Diffuse hepatic steatosis.  Normal gallbladder. Pancreas: Unremarkable Spleen: Unremarkable Adrenals/Urinary Tract: Adrenal glands and kidneys are unremarkable. Mild bladder wall thickening is diffuse. The bladder is relatively decompressed. Stomach/Bowel: Normal appendix. No obvious mass in the colon. There is wall thickening of the distal sigmoid common without adjacent inflammatory changes which may related to decompression. No evidence of small-bowel obstruction. Vascular/Lymphatic: No abnormal retroperitoneal adenopathy or aortic aneurysm. Circumaortic left renal vein anatomy. Reproductive: Normal prostate. Other: Left inguinal hernia contains adipose tissue. There is stranding within the hernia sac. There is also stranding within the fat in the left lower quadrant adjacent to the hernia sac. These findings suggest an element of an inflammatory process. The closest segment of large bowel has a normal appearance without evidence of inflammation. Musculoskeletal: No vertebral compression. IMPRESSION: Left inguinal hernia contains adipose tissue. There is fatty stranding within the fat in the hernia sac and fat extending towards the hernia sac. An inflammatory process or vascular compromise cannot be excluded such as strangulated hernia. There is diffuse wall thickening of the bladder as well as mild wall thickening of the distal sigmoid colon. An inflammatory process is not excluded. These findings may simply reflect decompression. Electronically Signed   By: Jolaine Click M.D.   On: 09/03/2016 12:41      EKG: Independently reviewed. NSR, no ACS  Assessment/Plan Active Problems:   GERD (gastroesophageal reflux disease)   Essential hypertension   Odynophagia   Dysphagia   Esophagitis   Dysphagia/odynophagia: Suspect severe erosive esophagitis from PUD. History of treated H. pylori in 2014. History of GERD/Gastritis. Reported 6 pound weight loss over the last several days due to inability to eat or drink. Unable to take medications. EDP consult GI who recommends admission for EGD. Hold further ABX at this time.  - EGD per GI - Clear liquid diet - Nothing by mouth after midnight - IV Protonix 40 mg twice a day - IV Pepcid - GI cocktail - repeat CT soft tissue neck  Hypokalemia: Mild. 3.3 - KCL   HTN: - continue norvasc - Hydralazine prn  DVT prophylaxis: SCD  Code Status: full  Family Communication: brother  Disposition Plan: pending  GI eval and EGD  Consults called: GI  Admission status: observation     MERRELL, DAVID J MD [facility]  If 7PM-7AM, please contact night-coverage [email or web address] Password [email or web address]  09/03/2016, 6:02 PM

## 2016-09-03 NOTE — ED Notes (Signed)
Spoke with inpatient MD, Andi DevonXenia, about the CT and contrast.  Will change accordingly.

## 2016-09-03 NOTE — ED Notes (Signed)
Patient called out for pain in IV arm.  Potassium dose cut in half to 50 mL/hr.

## 2016-09-03 NOTE — ED Provider Notes (Addendum)
[facility]-EMERGENCY DEPT Provider Note   CSN: [id number] Arrival date & time: 09/02/16  2317     History   Chief Complaint Chief Complaint  Patient presents with  . Chest Pain  . Dysphagia    HPI Caleb Pace is a 46 y.o. male.  Patient with complaint of substernal chest pain and difficulty swallowing and pain in the back of the throat. As well as upper abdominal pain predominantly left upper quadrant. Patient just seen at the [facility] emergency department on August 7 with diagnosis of dysphagia given referral to gastroenterology. Patient also seen July 31 for chest pain with negative workup. Patient also seen July 22 for tonsillar pharyngitis. No admission for any of these visits. On July 22 patient had CT soft tissue neck which showed some inflammatory changes. Also many months ago patient had an ultrasound of the abdomen without any acute findings.      Past Medical History:  Diagnosis Date  . GERD (gastroesophageal reflux disease)   . Helicobacter pylori gastritis   . Hypertension     Patient Active Problem List   Diagnosis Date Noted  . Odynophagia 09/03/2016  . Tonsillopharyngitis 08/18/2016  . Chronic heartburn 08/18/2016  . Essential hypertension 06/19/2016  . Psoriasis 01/30/2016  . Other dysphagia 12/22/2012  . GERD (gastroesophageal reflux disease) 04/06/2012  . History of Helicobacter pylori infection 03/16/2012  . Dyspepsia 03/16/2012    Past Surgical History:  Procedure Laterality Date  . ESOPHAGOGASTRODUODENOSCOPY N/A 12/22/2012   Procedure: ESOPHAGOGASTRODUODENOSCOPY (EGD);  Surgeon: Meryl Dare, MD;  Location: [facility];  Service: Endoscopy;  Laterality: N/A;       Home Medications    Prior to Admission medications   Medication Sig Start Date End Date Taking? Authorizing Provider  amLODipine (NORVASC) 5 MG tablet Take 1 tablet (5 mg total) by mouth daily. 08/18/16  Yes Sagardia, Eilleen Kempf, MD  omeprazole (PRILOSEC) 40 MG capsule  Take 1 capsule (40 mg total) by mouth daily. 08/23/16  Yes Sunnie Nielsen, DO  clindamycin (CLEOCIN) 300 MG capsule Take 1 capsule (300 mg total) by mouth 4 (four) times daily. X 7 days Patient not taking: Reported on 09/02/2016 08/17/16   Zadie Rhine, MD  ranitidine (ZANTAC) 300 MG tablet Take 1 tablet (300 mg total) by mouth at bedtime. Patient not taking: Reported on 09/02/2016 08/23/16   Sunnie Nielsen, DO    Family History History reviewed. No pertinent family history.  Social History Social History  Substance Use Topics  . Smoking status: Former Smoker    Packs/day: 0.25    Years: 10.00    Types: Cigarettes  . Smokeless tobacco: Never Used  . Alcohol use 0.0 oz/week    5 - 6 Cans of beer per week     Allergies   Amoxicillin; Asa [aspirin]; Lactose intolerance (gi); and Penicillins   Review of Systems Review of Systems  Constitutional: Negative for fever.  HENT: Positive for trouble swallowing.   Eyes: Negative for visual disturbance.  Respiratory: Negative for shortness of breath.   Cardiovascular: Positive for chest pain.  Gastrointestinal: Positive for abdominal pain. Negative for vomiting.  Genitourinary: Negative for dysuria.  Musculoskeletal: Negative for back pain.  Skin: Negative for rash.  Neurological: Negative for headaches.  Hematological: Does not bruise/bleed easily.  Psychiatric/Behavioral: Negative for confusion.     Physical Exam Updated Vital Signs BP 138/90   Pulse 66   Temp 98.1 F (36.7 C) (Oral)   Resp 15   Ht 1.676 m (5\' 6" )  Wt 79.4 kg (175 lb)   SpO2 98%   BMI 28.25 kg/m   Physical Exam  Constitutional: He is oriented to person, place, and time. He appears well-developed and well-nourished. No distress.  HENT:  Head: Normocephalic and atraumatic.  Mouth/Throat: Oropharynx is clear and moist. No oropharyngeal exudate.  Eyes: Pupils are equal, round, and reactive to light. Conjunctivae and EOM are normal.  Neck: Normal  range of motion. Neck supple.  Cardiovascular: Normal rate, regular rhythm and normal heart sounds.   Pulmonary/Chest: Effort normal and breath sounds normal.  Abdominal: Soft. Bowel sounds are normal. There is no tenderness.  Musculoskeletal: Normal range of motion. He exhibits no edema.  Neurological: He is alert and oriented to person, place, and time. No cranial nerve deficit or sensory deficit. He exhibits normal muscle tone. Coordination normal.  Skin: Skin is warm.  Nursing note and vitals reviewed.    ED Treatments / Results  Labs (all labs ordered are listed, but only abnormal results are displayed) Labs Reviewed  BASIC METABOLIC PANEL - Abnormal; Notable for the following:       Result Value   Potassium 3.3 (*)    Glucose, Bld 108 (*)    Creatinine, Ser 0.55 (*)    All other components within normal limits  HEPATIC FUNCTION PANEL - Abnormal; Notable for the following:    Total Protein 6.4 (*)    Total Bilirubin 1.3 (*)    Indirect Bilirubin 1.0 (*)    All other components within normal limits  CBC  LIPASE, BLOOD  I-STAT TROPONIN, ED  I-STAT TROPONIN, ED    EKG  EKG Interpretation  Date/Time:  Tuesday September 02 2016 23:22:57 EDT Ventricular Rate:  79 PR Interval:  148 QRS Duration: 80 QT Interval:  396 QTC Calculation: 454 R Axis:   53 Text Interpretation:  Normal sinus rhythm Normal ECG Confirmed by Vanetta MuldersZackowski, Terrill Alperin [phone]([id number]) on 09/03/2016 7:20:28 AM Also confirmed by Vanetta MuldersZackowski, Hyman Crossan [phone]([id number]), editor Elita QuickWatlington, Beverly ([id number])  on 09/03/2016 7:58:45 AM       Radiology Dg Chest 2 View  Result Date: 09/02/2016 CLINICAL DATA:  Acute onset of generalized chest pain and left upper quadrant abdominal pain. Dysphagia. Initial encounter. EXAM: CHEST  2 VIEW COMPARISON:  Chest radiograph performed 08/26/2016 FINDINGS: The lungs are well-aerated and clear. There is no evidence of focal opacification, pleural effusion or pneumothorax. The heart is normal in size; the  mediastinal contour is within normal limits. No acute osseous abnormalities are seen. IMPRESSION: No acute cardiopulmonary process seen. Electronically Signed   By: Roanna RaiderJeffery  Chang M.D.   On: 09/02/2016 23:45   Ct Abdomen Pelvis W Contrast  Result Date: 09/03/2016 CLINICAL DATA:  Diffuse upper abdominal pain for 3 days. EXAM: CT ABDOMEN AND PELVIS WITH CONTRAST TECHNIQUE: Multidetector CT imaging of the abdomen and pelvis was performed using the standard protocol following bolus administration of intravenous contrast. CONTRAST:  100mL ISOVUE-300 IOPAMIDOL (ISOVUE-300) INJECTION 61% COMPARISON:  None. FINDINGS: Lower chest: Dependent atelectasis. Hepatobiliary: Diffuse hepatic steatosis.  Normal gallbladder. Pancreas: Unremarkable Spleen: Unremarkable Adrenals/Urinary Tract: Adrenal glands and kidneys are unremarkable. Mild bladder wall thickening is diffuse. The bladder is relatively decompressed. Stomach/Bowel: Normal appendix. No obvious mass in the colon. There is wall thickening of the distal sigmoid common without adjacent inflammatory changes which may related to decompression. No evidence of small-bowel obstruction. Vascular/Lymphatic: No abnormal retroperitoneal adenopathy or aortic aneurysm. Circumaortic left renal vein anatomy. Reproductive: Normal prostate. Other: Left inguinal hernia contains adipose tissue. There is stranding  within the hernia sac. There is also stranding within the fat in the left lower quadrant adjacent to the hernia sac. These findings suggest an element of an inflammatory process. The closest segment of large bowel has a normal appearance without evidence of inflammation. Musculoskeletal: No vertebral compression. IMPRESSION: Left inguinal hernia contains adipose tissue. There is fatty stranding within the fat in the hernia sac and fat extending towards the hernia sac. An inflammatory process or vascular compromise cannot be excluded such as strangulated hernia. There is diffuse  wall thickening of the bladder as well as mild wall thickening of the distal sigmoid colon. An inflammatory process is not excluded. These findings may simply reflect decompression. Electronically Signed   By: Jolaine Click M.D.   On: 09/03/2016 12:41    Procedures Procedures (including critical care time)  Medications Ordered in ED Medications  0.9 %  sodium chloride infusion (100 mL/hr Intravenous New Bag/Given 09/03/16 1008)  iopamidol (ISOVUE-300) 61 % injection (not administered)  pantoprazole (PROTONIX) injection 40 mg (40 mg Intravenous Given 09/03/16 1008)  iopamidol (ISOVUE-300) 61 % injection (100 mLs  Contrast Given 09/03/16 1219)     Initial Impression / Assessment and Plan / ED Course  I have reviewed the triage vital signs and the nursing notes.  Pertinent labs & imaging results that were available during my care of the patient were reviewed by me and considered in my medical decision making (see chart for details).     Patient just seen yesterday patient with persistent dysphagia also now with chest pain which is probably related to that and upper abdomen pain mostly left upper quadrant. Patient was given referral to [facility] gastroenterology. But was confused about how to make the appointment.  Patient's troponins here were negative 2. EKG without acute changes. Chest x-ray was negative. Review of patient's frequent visits including those for chest pain at the negative before has not had a CT scan of his abdomen recently. Did have an ultrasound many months ago which was negative. Recently had CT of his neck which showed inflammatory changes in the pharynx area.  Suspect this may be related to significant GERD. Clinically not concerned about food impaction. Also peptic ulcer disease as the etiology possible as well.  CT scan of the abdomen negative file for anything that would be consistent with his complaints here today. There was evidence of a groin hernia with just adipose in it.  Also some evidence of some inflammation of the sigmoid colon. No evidence of diverticulitis. Patient does not have any complaints in those areas.  Patient was [facility] by [facility] gastroenterology here today seen by Dr. Leone Payor who is planning to go ahead and do upper endoscopy on the patient tomorrow. He requested observation admission by internal medicine so that procedure could be completed.    Final Clinical Impressions(s) / ED Diagnoses   Final diagnoses:  Dysphagia, unspecified type    New Prescriptions New Prescriptions   No medications on file     Vanetta Mulders, MD 09/03/16 1356    Vanetta Mulders, MD 09/03/16 1357

## 2016-09-03 NOTE — ED Notes (Signed)
Pt to CT with transporter in wheelchair.

## 2016-09-03 NOTE — ED Notes (Signed)
ED Provider at bedside. 

## 2016-09-03 NOTE — ED Notes (Signed)
Called CT; informed them of order change.

## 2016-09-04 ENCOUNTER — Observation Stay (HOSPITAL_COMMUNITY): Payer: Self-pay | Admitting: Anesthesiology

## 2016-09-04 ENCOUNTER — Encounter (HOSPITAL_COMMUNITY)
Admission: EM | Disposition: A | Discharge status: Home / Self Care | Payer: Self-pay | Source: Home / Self Care | Attending: Family Medicine

## 2016-09-04 ENCOUNTER — Encounter (HOSPITAL_COMMUNITY): Payer: Self-pay | Admitting: Internal Medicine

## 2016-09-04 DIAGNOSIS — R1310 Dysphagia, unspecified: Secondary | ICD-10-CM

## 2016-09-04 DIAGNOSIS — K4090 Unilateral inguinal hernia, without obstruction or gangrene, not specified as recurrent: Secondary | ICD-10-CM

## 2016-09-04 DIAGNOSIS — E876 Hypokalemia: Secondary | ICD-10-CM

## 2016-09-04 DIAGNOSIS — J351 Hypertrophy of tonsils: Secondary | ICD-10-CM

## 2016-09-04 DIAGNOSIS — I10 Essential (primary) hypertension: Secondary | ICD-10-CM

## 2016-09-04 HISTORY — DX: Unilateral inguinal hernia, without obstruction or gangrene, not specified as recurrent: K40.90

## 2016-09-04 HISTORY — PX: ESOPHAGOGASTRODUODENOSCOPY: SHX5428

## 2016-09-04 HISTORY — PX: MALONEY DILATION: SHX5535

## 2016-09-04 LAB — COMPREHENSIVE METABOLIC PANEL
ALT: 45 U/L (ref 17–63)
AST: 30 U/L (ref 15–41)
Albumin: 3.4 g/dL — ABNORMAL LOW (ref 3.5–5.0)
Alkaline Phosphatase: 44 U/L (ref 38–126)
Anion gap: 8 (ref 5–15)
BUN: 6 mg/dL (ref 6–20)
CHLORIDE: 107 mmol/L (ref 101–111)
CO2: 24 mmol/L (ref 22–32)
CREATININE: 0.61 mg/dL (ref 0.61–1.24)
Calcium: 8.6 mg/dL — ABNORMAL LOW (ref 8.9–10.3)
GFR calc Af Amer: >60 mL/min (ref >60)
Glucose, Bld: 93 mg/dL (ref 65–99)
Potassium: 3.7 mmol/L (ref 3.5–5.1)
Sodium: 139 mmol/L (ref 135–145)
Total Bilirubin: 1.9 mg/dL — ABNORMAL HIGH (ref 0.3–1.2)
Total Protein: 6 g/dL — ABNORMAL LOW (ref 6.5–8.1)

## 2016-09-04 LAB — HIV ANTIBODY (ROUTINE TESTING W REFLEX): HIV Screen 4th Generation wRfx: NONREACTIVE

## 2016-09-04 LAB — CBC
HCT: 41.2 % (ref 39.0–52.0)
HEMOGLOBIN: 13.9 g/dL (ref 13.0–17.0)
MCH: 31.7 pg (ref 26.0–34.0)
MCHC: 33.7 g/dL (ref 30.0–36.0)
MCV: 94.1 fL (ref 78.0–100.0)
PLATELETS: 158 10*3/uL (ref 150–400)
RBC: 4.38 MIL/uL (ref 4.22–5.81)
RDW: 13 % (ref 11.5–15.5)
WBC: 4.4 10*3/uL (ref 4.0–10.5)

## 2016-09-04 SURGERY — EGD (ESOPHAGOGASTRODUODENOSCOPY)
Anesthesia: Monitor Anesthesia Care

## 2016-09-04 MED ORDER — MIDAZOLAM HCL 5 MG/5ML IJ SOLN
INTRAMUSCULAR | Status: DC | PRN
  Administered 2016-09-04: 2 mg via INTRAVENOUS

## 2016-09-04 MED ORDER — POTASSIUM CHLORIDE 10 MEQ/100ML IV SOLN
10.0000 meq | INTRAVENOUS | Status: AC
Start: 2016-09-04 — End: 2016-09-04
  Administered 2016-09-04 (×3): 10 meq via INTRAVENOUS
  Filled 2016-09-04 (×3): qty 100

## 2016-09-04 MED ORDER — FENTANYL CITRATE (PF) 100 MCG/2ML IJ SOLN
INTRAMUSCULAR | Status: DC | PRN
  Administered 2016-09-04: 100 ug via INTRAVENOUS

## 2016-09-04 MED ORDER — LACTATED RINGERS IV SOLN
INTRAVENOUS | Status: DC | PRN
  Administered 2016-09-04: 12:00:00 via INTRAVENOUS

## 2016-09-04 MED ORDER — PROPOFOL 500 MG/50ML IV EMUL
INTRAVENOUS | Status: DC | PRN
  Administered 2016-09-04: 75 ug/kg/min via INTRAVENOUS

## 2016-09-04 MED ORDER — LIDOCAINE HCL (CARDIAC) 20 MG/ML IV SOLN
INTRAVENOUS | Status: DC | PRN
  Administered 2016-09-04: 100 mg via INTRATRACHEAL

## 2016-09-04 NOTE — Anesthesia Preprocedure Evaluation (Signed)
Anesthesia Evaluation  Patient identified by MRN, date of birth, ID band Patient awake    Reviewed: Allergy & Precautions, NPO status , Patient's Chart, lab work & pertinent test results  Airway Mallampati: II  TM Distance: >3 FB Neck ROM: Full    Dental no notable dental hx.    Pulmonary former smoker,    Pulmonary exam normal breath sounds clear to auscultation       Cardiovascular hypertension, Normal cardiovascular exam Rhythm:Regular Rate:Normal     Neuro/Psych negative neurological ROS  negative psych ROS   GI/Hepatic Neg liver ROS, GERD  Medicated,  Endo/Other  negative endocrine ROS  Renal/GU negative Renal ROS  negative genitourinary   Musculoskeletal negative musculoskeletal ROS (+)   Abdominal   Peds negative pediatric ROS (+)  Hematology negative hematology ROS (+)   Anesthesia Other Findings   Reproductive/Obstetrics negative OB ROS                             Anesthesia Physical Anesthesia Plan  ASA: II  Anesthesia Plan: MAC   Post-op Pain Management:    Induction: Intravenous  PONV Risk Score and Plan: 1 and Treatment may vary due to age or medical condition  Airway Management Planned: Nasal Cannula  Additional Equipment:   Intra-op Plan:   Post-operative Plan:   Informed Consent: I have reviewed the patients History and Physical, chart, labs and discussed the procedure including the risks, benefits and alternatives for the proposed anesthesia with the patient or authorized representative who has indicated his/her understanding and acceptance.   Dental advisory given  Plan Discussed with: CRNA  Anesthesia Plan Comments:         Anesthesia Quick Evaluation

## 2016-09-04 NOTE — Progress Notes (Signed)
Patient seen today, resting in bed. Wife at bedside.  He reports no pain and no distress.  He is mildly anxious about the exam today, but ready to proceed.    Flint MelterKristen Mayer Vondrak, PA-S [facility]

## 2016-09-04 NOTE — Progress Notes (Signed)
Delmo Finkle to be D/C'd home per MD order.  Discussed with the patient and all questions fully answered.  VSS, Skin clean, dry and intact without evidence of skin break down, no evidence of skin tears noted. IV catheter discontinued intact. Site without signs and symptoms of complications. Dressing and pressure applied.  An After Visit Summary was printed and given to the patient. Patient received prescription.  D/c education completed with patient/family including follow up instructions, medication list, d/c activities limitations if indicated, with other d/c instructions as indicated by MD - patient able to verbalize understanding, all questions fully answered.   Patient instructed to return to ED, call 911, or call MD for any changes in condition.   Patient left from floor ambulated with family, and D/C home via private auto.  Evern BioLoren D Miana Politte 09/04/2016 4:21 PM

## 2016-09-04 NOTE — Transfer of Care (Signed)
Immediate Anesthesia Transfer of Care Note  Patient: Caleb Pace  Procedure(s) Performed: Procedure(s): ESOPHAGOGASTRODUODENOSCOPY (EGD) (N/A) SAVORY DILATION (N/A)  Patient Location: Endoscopy Unit  Anesthesia Type:MAC  Level of Consciousness: awake, alert  and oriented  Airway & Oxygen Therapy: Patient Spontanous Breathing and Patient connected to nasal cannula oxygen  Post-op Assessment: Report given to RN, Post -op Vital signs reviewed and stable and Patient moving all extremities X 4  Post vital signs: Reviewed and stable  Last Vitals:  Vitals:   09/04/16 0857 09/04/16 1219  BP: 120/68 (!) 136/91  Pulse:  65  Resp:  12  Temp:  36.7 C  SpO2:  97%    Last Pain:  Vitals:   09/04/16 1219  TempSrc: Oral  PainSc:          Complications: No apparent anesthesia complications

## 2016-09-04 NOTE — Interval H&P Note (Signed)
History and Physical Interval Note:  09/04/2016 12:14 PM  Caleb SavoyBernardo Pace  has presented today for surgery, with the diagnosis of Dysphagia. Weight loss  The various methods of treatment have been discussed with the patient and family. After consideration of risks, benefits and other options for treatment, the patient has consented to  Procedure(s): ESOPHAGOGASTRODUODENOSCOPY (EGD) (N/A) SAVORY DILATION (N/A) as a surgical intervention .  The patient's history has been reviewed, patient examined, no change in status, stable for surgery.  I have reviewed the patient's chart and labs.  Questions were answered to the patient's satisfaction.     Stan Headarl Gessner

## 2016-09-04 NOTE — Anesthesia Postprocedure Evaluation (Signed)
Anesthesia Post Note  Patient: Caleb Pace  Procedure(s) Performed: Procedure(s) (LRB): ESOPHAGOGASTRODUODENOSCOPY (EGD) (N/A) SAVORY DILATION (N/A)     Patient location during evaluation: PACU Anesthesia Type: MAC Level of consciousness: awake and alert Pain management: pain level controlled Vital Signs Assessment: post-procedure vital signs reviewed and stable Respiratory status: spontaneous breathing, nonlabored ventilation, respiratory function stable and patient connected to nasal cannula oxygen Cardiovascular status: stable and blood pressure returned to baseline Anesthetic complications: no    Last Vitals:  Vitals:   09/04/16 1305 09/04/16 1310  BP: 116/71 129/73  Pulse: (!) 59   Resp: 11   Temp:    SpO2: 97%     Last Pain:  Vitals:   09/04/16 1255  TempSrc: Oral  PainSc:                  Audry Pili

## 2016-09-04 NOTE — Op Note (Addendum)
[facility] Patient Name: Caleb Pace Procedure Date : 09/04/2016 MRN: [id number] Attending MD: Iva Boop , MD Date of Birth: 11-22-1970 CSN: [id number] Age: 46 Admit Type: Inpatient Procedure:                Upper GI endoscopy Indications:              Dysphagia Providers:                Iva Boop, MD, Tillie Fantasia, RN, Jacqulyn Liner, Technician Referring MD:              Medicines:                Propofol per Anesthesia, Monitored Anesthesia Care Complications:            No immediate complications. Estimated Blood Loss:     Estimated blood loss: none. Procedure:                Pre-Anesthesia Assessment:                           - Prior to the procedure, a History and Physical                            was performed, and patient medications and                            allergies were reviewed. The patient's tolerance of                            previous anesthesia was also reviewed. The risks                            and benefits of the procedure and the sedation                            options and risks were discussed with the patient.                            All questions were answered, and informed consent                            was obtained. Prior Anticoagulants: The patient has                            taken no previous anticoagulant or antiplatelet                            agents. ASA Grade Assessment: II - A patient with                            mild systemic disease. After reviewing the risks  and benefits, the patient was deemed in                            satisfactory condition to undergo the procedure.                           After obtaining informed consent, the endoscope was                            passed under direct vision. Throughout the                            procedure, the patient's blood pressure, pulse, and                            oxygen  saturations were monitored continuously. The                            EG-[id number] ([id number]) scope was introduced through the                            mouth, and advanced to the second part of duodenum.                            The upper GI endoscopy was accomplished without                            difficulty. The patient tolerated the procedure                            well. Scope In: Scope Out: Findings:      The esophagus was normal.      The stomach was normal.      The examined duodenum was normal.      The cardia and gastric fundus were normal on retroflexion.      The scope was withdrawn. Dilation was performed in the entire esophagus       with a Maloney dilator with mild resistance at 54 Fr. Estimated blood       loss: none. Re look - no trauma Impression:               - Normal esophagus.                           - Normal stomach.                           - Normal examined duodenum.                           - Dilation performed in the entire esophagus.                           - No specimens collected. Moderate Sedation:      Please see anesthesia notes, moderate sedation not given Recommendation:           -  Patient has a contact number available for                            emergencies. The signs and symptoms of potential                            delayed complications were discussed with the                            patient. Return to normal activities tomorrow.                            Written discharge instructions were provided to the                            patient.                           - Clear liquids x 1 hour then soft foods rest of                            day. Start prior diet tomorrow.                           - I spoke to his wife - patient is anxious about                            work - not working                           I think this is likely source of problem as CT's                            don't give a clear cause - I  doubt the swollen                            tonsils are the clear cause but he could see ENT if                            persists                           I do nopt recommend a PPI                           Anxiety could play a role                           See GI PRN                           Signing off                           Thanks                           -  Continue present medications. Procedure Code(s):        --- Professional ---                           [phone], Esophagogastroduodenoscopy, flexible,                            transoral; diagnostic, including collection of                            specimen(s) by brushing or washing, when performed                            (separate procedure)                           43450, Dilation of esophagus, by unguided sound or                            bougie, single or multiple passes Diagnosis Code(s):        --- Professional ---                           R13.10, Dysphagia, unspecified CPT copyright 2016 [employer]. All rights reserved. The codes documented in this report are preliminary and upon coder review may  be revised to meet current compliance requirements. Iva Boop, MD 09/04/2016 1:01:19 PM This report has been signed electronically. Number of Addenda: 0

## 2016-09-04 NOTE — Progress Notes (Signed)
Received report from Brittany, RN in the ED. 

## 2016-09-04 NOTE — Progress Notes (Signed)
NURSING PROGRESS Caleb Pace  Caleb BecerrilMRN: [id number] Admission Data: 09/04/2016 at 2345 Attending Provider: Ozella RocksMerrell, David J, MD PCP: Georgina QuintSagardia, Miguel Jose, MD Code status: Full  Allergies:  Allergies  Allergen Reactions  . Amoxicillin Other (See Comments)    Anxiety   . Asa [Aspirin]     Upset stomach   . Lactose Intolerance (Gi)     Upset stomach   . Penicillins Other (See Comments)    Anxiety Has patient had a PCN reaction causing immediate rash, facial/tongue/throat swelling, SOB or lightheadedness with hypotension: no, anxious  Has patient had a PCN reaction causing severe rash involving mucus membranes or skin necrosis: no Has patient had a PCN reaction that required hospitalization: no Has patient had a PCN reaction occurring within the last 10 years: yes If all of the above answers are "NO", then may proceed with Cephalosporin use.   Past Medical History:  Past Medical History:  Diagnosis Date  . GERD (gastroesophageal reflux disease)   . Helicobacter pylori gastritis   . Hypertension    Past Surgical History:  Past Surgical History:  Procedure Laterality Date  . ESOPHAGOGASTRODUODENOSCOPY N/A 12/22/2012   Procedure: ESOPHAGOGASTRODUODENOSCOPY (EGD);  Surgeon: Meryl DareMalcolm T Stark, MD;  [facility];  Service: Endoscopy;  Laterality: N/A;   Caleb SavoyBernardo Pace is a 46 y.o. male patient, arrived to floor in room 5W07 via wheelchair, transferred from ED. Patient alert and oriented X 4. No acute distress noted. Denies pain.   Vital signs: Oral temperature 98.4 F (36.9 C), Blood pressure 146/75, Pulse 65, RR 18, SpO2 98 % on room air. Height 5'6", weight 176 lbs (79.8 kg).   IV access: Left AC-infusing Normal Saline at 16100mL/hr; condition patent and no redness.  Skin: intact, no pressure ulcer noted in sacral area. MASD located between both buttocks. Second verified by Lissa HoardSonia C.,RN   Patient's ID armband verified with patient/ family, and in place.  Information packet given to patient/ family. Fall risk assessed, SR up X2, patient/ family able to verbalize understanding of risks associated with falls and to call nurse or staff to assist before getting out of bed. Patient/ family oriented to room and equipment. Call bell within reach.

## 2016-09-04 NOTE — Discharge Summary (Signed)
Discharge Summary  Caleb Pace ZOX:[id number] DOB: 1970/12/05  PCP: Georgina Quint, MD  Admit date: 09/03/2016 Discharge date: 09/04/2016  Time spent: >1mins Time spent on coordination of care, patient and family education/counseling through interpretor   Recommendations for Outpatient Follow-up:  1. F/u with PMD within a week  for hospital discharge follow up, repeat cbc/bmp at follow up 2. F/u with GI Dr Leone Payor as needed 3. F/u with ENT for swollen tonsils  Discharge Diagnoses:  Active Hospital Problems   Diagnosis Date Noted  . Left inguinal hernia 09/04/2016  . Odynophagia 09/03/2016  . Esophagitis 09/03/2016  . Dysphagia   . Essential hypertension 06/19/2016  . GERD (gastroesophageal reflux disease) 04/06/2012    Resolved Hospital Problems   Diagnosis Date Noted Date Resolved  No resolved problems to display.    Discharge Condition: stable  Diet recommendation: heart healthy/carb modified  Filed Weights   09/03/16 1309 09/03/16 2305 09/04/16 1219  Weight: 79.4 kg (175 lb) 79.8 kg (176 lb) 79.8 kg (176 lb)    History of present illness:  PCP: Georgina Quint, MD Patient coming from: Home  Chief Complaint: difficulty and pain w/ swallowing.   HPI: Caleb Pace is a 45 y.o. male with medical history significant of GERD, H. pylori gastritis, hypertension.   2 wks history of worsening dysphagia and odynophagia. Problem has been present for several months prior to acute worsening. Difficulty with swallowing solids and liquids. Patient reports a 6 pound weight loss over the last several days. Intermittent improvement with short treatments of oral antibiotics and steroids and PPI. Denies abdominal pain, nausea, chest pain, shortness of breath, palpitations, fevers, dysuria, frequency, melena, hematochezia, hematemesis. Patient does endorse some vomiting of food with attempted eating. The patient reports previous EGD in 2014 which showed H. pylori  which was effectively treated. Patient went to [facility] on 09/01/2016 for evaluation of the same problem. At that point time patient was given fluids for last. Acidemia and was scheduled to follow-up with GI within 1 week of time of discharge.  Subjective relief reported with over-the-counter antacids.   ED Course: GI consult recommending admission for EGD. Start on IV fluids and Protonix. Objective findings below.   Hospital Course:  Active Problems:   GERD (gastroesophageal reflux disease)   Essential hypertension   Odynophagia   Dysphagia   Esophagitis   Left inguinal hernia  Dysphagia/odynophagia:  -initially Suspect severe erosive esophagitis from PUD. History of treated H. pylori in 2014. History of GERD/Gastritis. Reported 6 pound weight loss over the last several days due to inability to eat or drink. Unable to take medications. EDP consult GI who recommends admission for EGD.   -  repeat CT soft tissue neck "Persistent palatine and lingual tonsillar enlargement mildly decreased from prior CT of the neck. No peritonsillar abscess, prevertebral fluid collection, or lymphadenopathy. Otherwise stable CT of the neck." -egd unremarkable, he is cleared to discharge home with gi follow up prn, gi recommend ENT outpatient follow up, but suspect symptom is from anxiety.   Persistent palatine and lingual tonsillar enlargement : ENT referral made  Hypokalemia: Mild. 3.3 - replaced   HTN: - continue norvasc  No english speaking, no insurance, case manager/financial counselor consulted. Input appreciated.   Code Status: full  Family Communication: brother , wife and sister in room through interpretor  Disposition Plan: home Consults called: GI , case manager  Procedures:  egd   Discharge Exam: BP 129/73   Pulse (!) 59   Temp 97.7  F (36.5 C) (Oral)   Resp 11   Ht 5\' 6"  (1.676 m)   Wt 79.8 kg (176 lb)   SpO2 97%   BMI 28.41 kg/m   General: NAD Cardiovascular:  RRR Respiratory: CTABL  Discharge Instructions You were cared for by a hospitalist during your hospital stay. If you have any questions about your discharge medications or the care you received while you were in the hospital after you are discharged, you can call the unit and asked to speak with the hospitalist on call if the hospitalist that took care of you is not available. Once you are discharged, your primary care physician will handle any further medical issues. Please note that NO REFILLS for any discharge medications will be authorized once you are discharged, as it is imperative that you return to your primary care physician (or establish a relationship with a primary care physician if you do not have one) for your aftercare needs so that they can reassess your need for medications and monitor your lab values.  Discharge Instructions    Diet - low sodium heart healthy    Complete by:  As directed    Soft diet today, resume regular low sodium diet tomorrow   Increase activity slowly    Complete by:  As directed      Allergies as of 09/04/2016      Reactions   Amoxicillin Other (See Comments)   Anxiety   Asa [aspirin]    Upset stomach    Lactose Intolerance (gi)    Upset stomach    Penicillins Other (See Comments)   Anxiety Has patient had a PCN reaction causing immediate rash, facial/tongue/throat swelling, SOB or lightheadedness with hypotension: no, anxious  Has patient had a PCN reaction causing severe rash involving mucus membranes or skin necrosis: no Has patient had a PCN reaction that required hospitalization: no Has patient had a PCN reaction occurring within the last 10 years: yes If all of the above answers are "NO", then may proceed with Cephalosporin use.      Medication List    STOP taking these medications   clindamycin 300 MG capsule Commonly known as:  CLEOCIN   ranitidine 300 MG tablet Commonly known as:  ZANTAC     TAKE these medications   amLODipine  5 MG tablet Commonly known as:  NORVASC Take 1 tablet (5 mg total) by mouth daily.   omeprazole 40 MG capsule Commonly known as:  PRILOSEC Take 1 capsule (40 mg total) by mouth daily.      Allergies  Allergen Reactions  . Amoxicillin Other (See Comments)    Anxiety   . Asa [Aspirin]     Upset stomach   . Lactose Intolerance (Gi)     Upset stomach   . Penicillins Other (See Comments)    Anxiety Has patient had a PCN reaction causing immediate rash, facial/tongue/throat swelling, SOB or lightheadedness with hypotension: no, anxious  Has patient had a PCN reaction causing severe rash involving mucus membranes or skin necrosis: no Has patient had a PCN reaction that required hospitalization: no Has patient had a PCN reaction occurring within the last 10 years: yes If all of the above answers are "NO", then may proceed with Cephalosporin use.   Follow-up Information    Dr. Dillard Cannon. Go on 09/08/2016.   Why:  ENT follow up appointment scheduled for 09/08/2016 at 1:15 p with Dr. Lemmie Evens information: Dr. Kristine Garbe. Ezzard Standing, MD [address] [place],  [place] [address] [phone]        Iva Boop, MD Follow up.   Specialty:  Gastroenterology Why:  as needed Contact information: [address] [place] [address] [phone]        Georgina Quint, MD Follow up in 1 week(s).   Specialty:  Internal Medicine Why:  hospital discharge follow up Contact information: [address] [place] [address] [phone]            The results of significant diagnostics from this hospitalization (including imaging, microbiology, ancillary and laboratory) are listed below for reference.    Significant Diagnostic Studies: Dg Neck Soft Tissue  Result Date: 08/17/2016 CLINICAL DATA:  Pain.  Throat fullness. EXAM: NECK SOFT TISSUES - 1+ VIEW COMPARISON:  None. FINDINGS: Retropharyngeal soft tissue fullness from C3 through C5 with  questionable airway narrowing. Epiglottis appears normal. No radio-opaque foreign body identified. IMPRESSION: Retropharyngeal soft tissue fullness from C3 through C5 with questionable airway narrowing. Recommend CT neck with contrast. Electronically Signed   By: Rubye Oaks M.D.   On: 08/17/2016 02:35   Dg Chest 2 View  Result Date: 09/02/2016 CLINICAL DATA:  Acute onset of generalized chest pain and left upper quadrant abdominal pain. Dysphagia. Initial encounter. EXAM: CHEST  2 VIEW COMPARISON:  Chest radiograph performed 08/26/2016 FINDINGS: The lungs are well-aerated and clear. There is no evidence of focal opacification, pleural effusion or pneumothorax. The heart is normal in size; the mediastinal contour is within normal limits. No acute osseous abnormalities are seen. IMPRESSION: No acute cardiopulmonary process seen. Electronically Signed   By: Roanna Raider M.D.   On: 09/02/2016 23:45   Ct Soft Tissue Neck W Contrast  Result Date: 09/03/2016 CLINICAL DATA:  46 y/o M; three months of dysphasia with worsening condition 8 in the past few days. EXAM: CT NECK WITH CONTRAST TECHNIQUE: Multidetector CT imaging of the neck was performed using the standard protocol following the bolus administration of intravenous contrast. CONTRAST:  75 cc Isovue-300 COMPARISON:  08/17/2016 CT of the neck. FINDINGS: Pharynx and larynx: Palatine and lingual tonsillar enlargement is mildly decreased in comparison with the prior CT of the neck. Known peritonsillar abscess. No inflammatory changes in parapharyngeal fat the prevertebral space. Salivary glands: No inflammation, mass, or stone. Thyroid: Normal. Lymph nodes: No lymphadenopathy by imaging criteria. Vascular: Negative. Limited intracranial: Negative. Visualized orbits: Negative. Mastoids and visualized paranasal sinuses: Clear. Skeleton: No acute or aggressive process. Upper chest: Negative. Other: None. IMPRESSION: Persistent palatine and lingual tonsillar  enlargement mildly decreased from prior CT of the neck. No peritonsillar abscess, prevertebral fluid collection, or lymphadenopathy. Otherwise stable CT of the neck. Electronically Signed   By: Mitzi Hansen M.D.   On: 09/03/2016 22:49   Ct Soft Tissue Neck W Contrast  Result Date: 08/17/2016 CLINICAL DATA:  Odynophagia.  Excess salivation EXAM: CT NECK WITH CONTRAST TECHNIQUE: Multidetector CT imaging of the neck was performed using the standard protocol following the bolus administration of intravenous contrast. CONTRAST:  75mL ISOVUE-300 IOPAMIDOL (ISOVUE-300) INJECTION 61% COMPARISON:  Soft tissue neck radiograph 08/17/2016 FINDINGS: Pharynx and larynx: --Nasopharynx: Fossae of Rosenmuller are clear. Normal adenoid tonsils for age. --Oral cavity and oropharynx: The palatine tonsils are enlarged. There is mild enlargement of the lingual tonsils. --Hypopharynx: Normal vallecula and pyriform sinuses. --Larynx: Normal epiglottis and pre-epiglottic space. Normal aryepiglottic and vocal folds. --Retropharyngeal space: No abscess, effusion or lymphadenopathy. Salivary glands: --Parotid: No mass lesion or inflammation. No sialolithiasis or ductal dilatation. --Submandibular: Symmetric  without inflammation. No sialolithiasis or ductal dilatation. --Sublingual: Normal. No ranula or other visible lesion of the base of tongue and floor of mouth. Thyroid: Normal. Lymph nodes: No enlarged or abnormal density lymph nodes. Vascular: Major cervical vessels are patent. Limited intracranial: Normal. Visualized orbits: Normal. Mastoids and visualized paranasal sinuses: No fluid levels or advanced mucosal thickening. No mastoid effusion. Skeleton: No bony spinal canal stenosis. No lytic or blastic lesions. Upper chest: Clear. Other: None. IMPRESSION: 1. Enlarged palatine and lingual tonsils, likely indicating acute tonsillopharyngitis. No peritonsillar abscess or fluid collection. 2. No retropharyngeal abscess,  effusion or lymphadenopathy. The airway is widely patent. Normal epiglottis. Electronically Signed   By: Deatra RobinsonKevin  Herman M.D.   On: 08/17/2016 03:53   Ct Abdomen Pelvis W Contrast  Result Date: 09/03/2016 CLINICAL DATA:  Diffuse upper abdominal pain for 3 days. EXAM: CT ABDOMEN AND PELVIS WITH CONTRAST TECHNIQUE: Multidetector CT imaging of the abdomen and pelvis was performed using the standard protocol following bolus administration of intravenous contrast. CONTRAST:  100mL ISOVUE-300 IOPAMIDOL (ISOVUE-300) INJECTION 61% COMPARISON:  None. FINDINGS: Lower chest: Dependent atelectasis. Hepatobiliary: Diffuse hepatic steatosis.  Normal gallbladder. Pancreas: Unremarkable Spleen: Unremarkable Adrenals/Urinary Tract: Adrenal glands and kidneys are unremarkable. Mild bladder wall thickening is diffuse. The bladder is relatively decompressed. Stomach/Bowel: Normal appendix. No obvious mass in the colon. There is wall thickening of the distal sigmoid common without adjacent inflammatory changes which may related to decompression. No evidence of small-bowel obstruction. Vascular/Lymphatic: No abnormal retroperitoneal adenopathy or aortic aneurysm. Circumaortic left renal vein anatomy. Reproductive: Normal prostate. Other: Left inguinal hernia contains adipose tissue. There is stranding within the hernia sac. There is also stranding within the fat in the left lower quadrant adjacent to the hernia sac. These findings suggest an element of an inflammatory process. The closest segment of large bowel has a normal appearance without evidence of inflammation. Musculoskeletal: No vertebral compression. IMPRESSION: Left inguinal hernia contains adipose tissue. There is fatty stranding within the fat in the hernia sac and fat extending towards the hernia sac. An inflammatory process or vascular compromise cannot be excluded such as strangulated hernia. There is diffuse wall thickening of the bladder as well as mild wall thickening  of the distal sigmoid colon. An inflammatory process is not excluded. These findings may simply reflect decompression. Electronically Signed   By: Jolaine ClickArthur  Hoss M.D.   On: 09/03/2016 12:41   Dg Chest Port 1 View  Result Date: 08/26/2016 CLINICAL DATA:  46 year old male with chest pain. EXAM: PORTABLE CHEST 1 VIEW COMPARISON:  Chest radiograph dated 01/02/2016 FINDINGS: The heart size and mediastinal contours are within normal limits. Both lungs are clear. The visualized skeletal structures are unremarkable. IMPRESSION: No active disease. Electronically Signed   By: Elgie CollardArash  Radparvar M.D.   On: 08/26/2016 19:14    Microbiology: No results found for this or any previous visit (from the past 240 hour(s)).   Labs: Basic Metabolic Panel:  Recent Labs Lab 09/02/16 2007 09/02/16 2327 09/04/16 0511  NA 141 137 139  K 3.8 3.3* 3.7  CL 109 108 107  CO2 25 23 24   GLUCOSE 105* 108* 93  BUN 7 6 6   CREATININE 0.54* 0.55* 0.61  CALCIUM 9.1 9.0 8.6*   Liver Function Tests:  Recent Labs Lab 09/03/16 0737 09/04/16 0511  AST 31 30  ALT 42 45  ALKPHOS 54 44  BILITOT 1.3* 1.9*  PROT 6.4* 6.0*  ALBUMIN 3.8 3.4*    Recent Labs Lab 09/03/16 0737  LIPASE 38  No results for input(s): AMMONIA in the last 168 hours. CBC:  Recent Labs Lab 09/02/16 2007 09/02/16 2327 09/04/16 0511  WBC 4.5 4.3 4.4  NEUTROABS 2.4  --   --   HGB 15.3 14.8 13.9  HCT 43.9 42.1 41.2  MCV 94.0 93.1 94.1  PLT 174 161 158   Cardiac Enzymes: No results for input(s): CKTOTAL, CKMB, CKMBINDEX, TROPONINI in the last 168 hours. BNP: BNP (last 3 results) No results for input(s): BNP in the last 8760 hours.  ProBNP (last 3 results) No results for input(s): PROBNP in the last 8760 hours.  CBG: No results for input(s): GLUCAP in the last 168 hours.     SignedAlbertine Grates MD, PhD  [facility] 09/04/2016, 2:48 PM

## 2016-09-04 NOTE — H&P (View-Only) (Signed)
 Caleb Pace 46 y.o. 12/02/1970 [id number]  Assessment & Plan:  1. Dysphagia- rapidly worsening 2. Odynophagia 3. RUQ pain x1 year 4. New onset Diarrhea   ED ordered CT scan- will review results  Schedule EGD with Dr. Gessner  Consider RUQ ultrasound pending CT results  Mr Caleb has a constellation of new and chronic symptoms that are somewhat worrisome for malignancy.  Pending testing results will do further work up.  CT scab was reassuring - LIH only sig abnormality  I have personally seen the patient, reviewed and repeated key elements of the history and physical and participated in formation of the assessment and plan the student has documented. The risks and benefits as well as alternatives of endoscopic procedure(s) have been discussed and reviewed. All questions answered. The patient agrees to proceed.   Carl E. Gessner, MD, FACG [facility] [phone] (pager) 09/03/2016 6:42 PM    Subjective:   Chief Complaint: Dysphagia RUQ pain radiating to back HPI Mr. Pace is pleasant 46 yo Hispanic gentleman with a past medical history significant for GERD, H. Pylori (fully treated).  We are seeing him today for consult of increasing dysphagia and odynophagia.  He says he has had some trouble swallowing for about 2 months, but in the last 3 weeks it has progressively gotten worse.  He has to chew his foods into very small bites and drink water to get the food to go down.  Within the last 2 weeks he has been experiencing early satiety and 6lb weight loss in the last week.  He often experiences a sore throat and globus sensation when he eats and says " I'm afraid to swallow sometimes" He has had increase in reflux and excessive salivation with mild nausea over the last 3 weeks. Recent new onset diarrhea in the last week.  Denies vomiting, constipation, fever, chills, night sweats. Denies hematochezia, melena.  RUQ pain has been chronic over the last  year. It has been gradually getting worse. He states it is sharp, burning and intermittent and often radiates to his back.  He feels like burping relives the pain.  He has not noticed any change in the pain with specific foods.    EGD 03/25/2012 Diagnosis  1. Surgical [P], gastric, bx - CHRONIC FOCALLY ACTIVE GASTRITIS. - POSITIVE FOR H PYLORI COLONIZATION. - NEGATIVE FOR DYSPLASIA AND MALIGNANCY. 2. Surgical [P], GE junction, bx - CHRONICALLY INFLAMED GE JUNCTION MUCOSA. - NEGATIVE FOR BARRETT'S MUCOSA.   Allergies  Allergen Reactions  . Asa [Aspirin]     Upset stomach   . Lactose Intolerance (Gi)     Upset stomach   . Penicillins     Has patient had a PCN reaction causing immediate rash, facial/tongue/throat swelling, SOB or lightheadedness with hypotension: no, anxious  Has patient had a PCN reaction causing severe rash involving mucus membranes or skin necrosis: no Has patient had a PCN reaction that required hospitalization: no Has patient had a PCN reaction occurring within the last 10 years: yes If all of the above answers are "NO", then may proceed with Cephalosporin use.   No outpatient prescriptions have been marked as taking for the 09/03/16 encounter (Hospital Encounter).   Past Medical History:  Diagnosis Date  . GERD (gastroesophageal reflux disease)   . Helicobacter pylori gastritis   . Hypertension    Past Surgical History:  Procedure Laterality Date  . ESOPHAGOGASTRODUODENOSCOPY N/A 12/22/2012   Procedure: ESOPHAGOGASTRODUODENOSCOPY (EGD);  Surgeon: Malcolm T Stark, MD;  Location: [facility];    Service: Endoscopy;  Laterality: N/A;   Social History   Social History  . Marital status: Married    Spouse name: N/A  . Number of children: 0  . Years of education: N/A   Occupational History  . PAINTER    Social History Main Topics  . Smoking status: Former Smoker    Packs/day: 0.25    Years: 10.00    Types: Cigarettes  . Smokeless tobacco: Never Used    . Alcohol use 0.0 oz/week    5 - 6 Cans of beer per week  . Drug use: No  . Sexual activity: Not on file   Other Topics Concern  . Not on file   Social History Narrative  . No narrative on file   family history is not on file.   Review of Systems Constitutional: positive for weight loss ENT: positive for sore throat, globus sensation GI: positive for dysphagia, nausea, abdominal pain, diarrhea  All other systems negative. See HPI for details.   Objective:   Physical Exam @BP 134/87   Pulse 70   Temp 98.1 F (36.7 C) (Oral)   Resp 13   SpO2 97% @  General:  Well-developed, well-nourished and in no acute distress Eyes:  Mild icterus, Pupils ERRLA ENT:   Mouth and posterior pharynx free of lesions. Non jaundiced Neck:   supple w/o thyromegaly or mass.  Lungs: Clear to auscultation bilaterally. Heart:   S1S2, no rubs, murmurs, gallops. Abdomen:  soft, no hepatosplenomegaly, hernia, or mass and BS+.  Mild TTP RUQ and LUQ, negative Murphy's Sign.  No guarding or rebound pain Rectal: Not Examined Lymph:  no cervical or supraclavicular adenopathy. Extremities:   no edema, cyanosis or clubbing Skin   no rash. No Jaundice noted Neuro:  A&O x 3.  Psych:  appropriate mood and  Affect.   Data Reviewed:  EGD Pathology from 03/22/2012 Diagnosis 1. Surgical [P], gastric, bx - CHRONIC FOCALLY ACTIVE GASTRITIS. - POSITIVE FOR H PYLORI COLONIZATION. - NEGATIVE FOR DYSPLASIA AND MALIGNANCY. 2. Surgical [P], GE junction, bx - CHRONICALLY INFLAMED GE JUNCTION MUCOSA. - NEGATIVE FOR BARRETT'S MUCOSA.  ER notes Labs AST: 31 ALT:42 Alk Phos: 54 Bili Direct: .3 Indirect Bili: 1.0 Total Bili 1.3  Kristen Moore, PA-S [facility] 

## 2016-09-07 ENCOUNTER — Encounter (HOSPITAL_COMMUNITY): Payer: Self-pay | Admitting: Internal Medicine

## 2018-08-19 IMAGING — CT CT ABD-PELV W/ CM
2 of 5 series · 16 of 46 positions shown, 18 images · IV contrast (APPLIED)
Comparison: None.

CLINICAL DATA: Diffuse upper abdominal pain for 3 days.

EXAM:
CT ABDOMEN AND PELVIS WITH CONTRAST
TECHNIQUE: Multidetector CT imaging of the abdomen and pelvis was performed
using the standard protocol following bolus administration of
intravenous contrast.
CONTRAST:  100mL 3XKF5Y-B88 IOPAMIDOL (3XKF5Y-B88) INJECTION 61%

[Series 3: abdomen 5.0 · axial · 0.79mm/px · z∈[+788,+1203]mm · 13 of 97 slices shown, 15 images]
[im 7/97  soft-tissue]
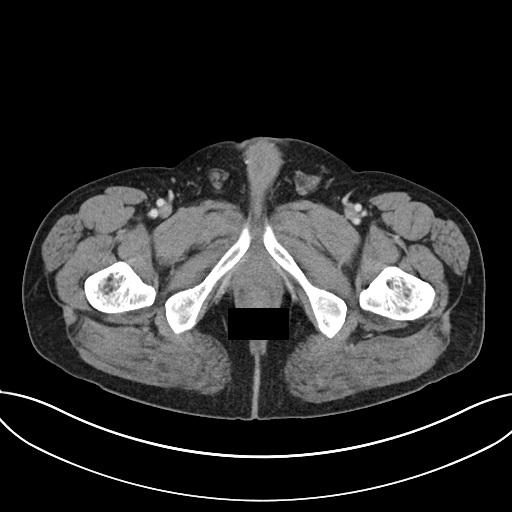
[im 7/97  bone]
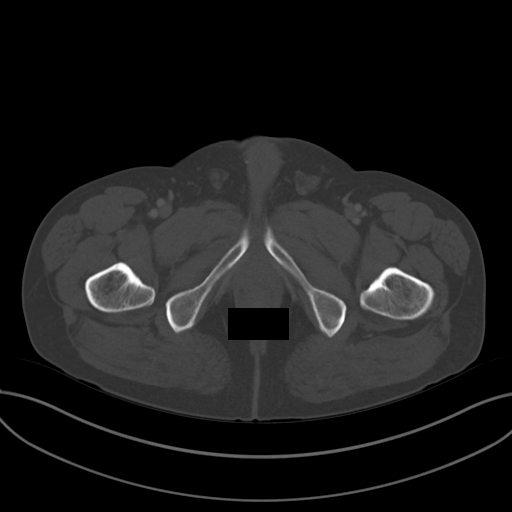
[im 13/97  soft-tissue]
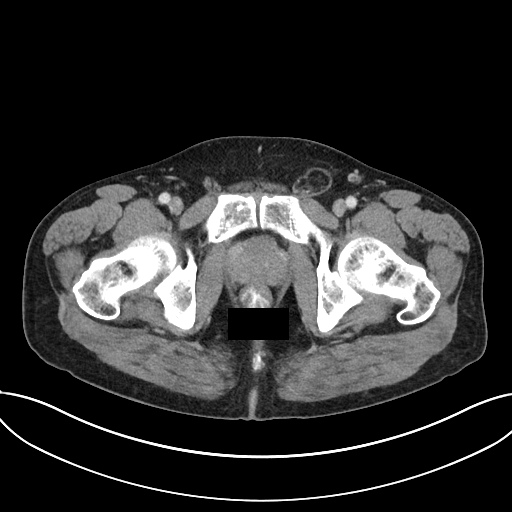
[im 20/97  soft-tissue]
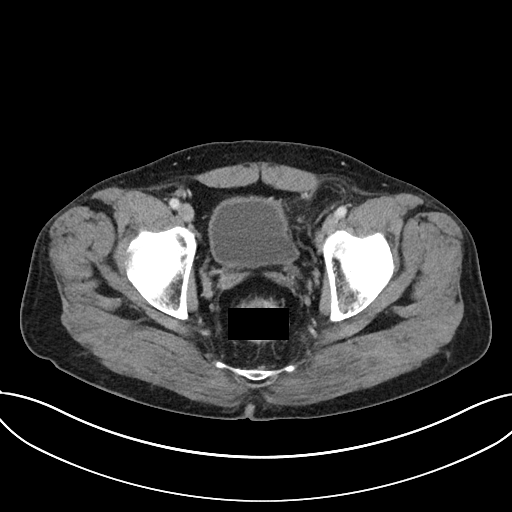
[im 26/97  soft-tissue]
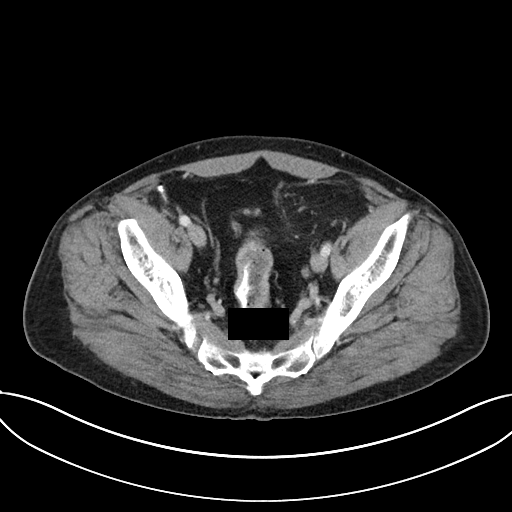
[im 33/97  soft-tissue]
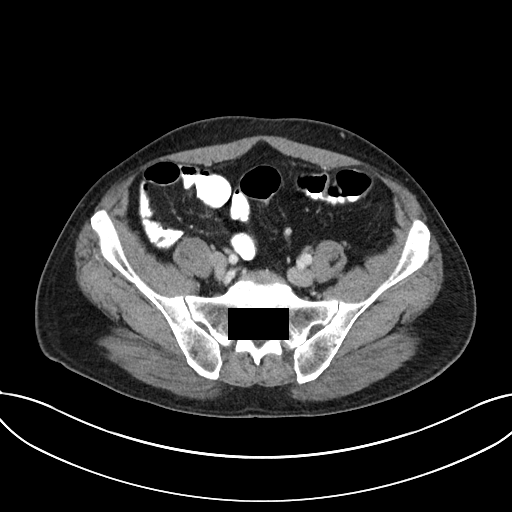
[im 39/97  soft-tissue]
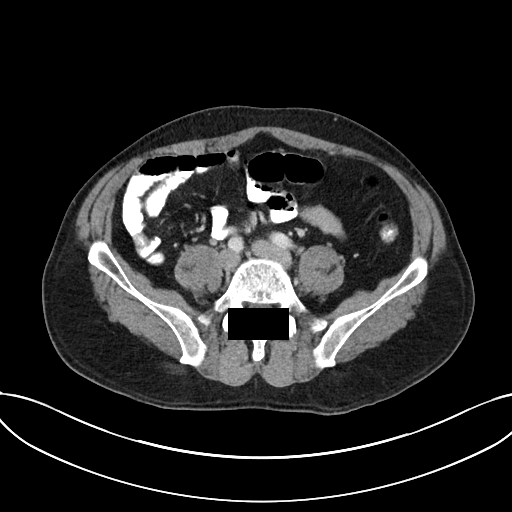
[im 52/97  soft-tissue]
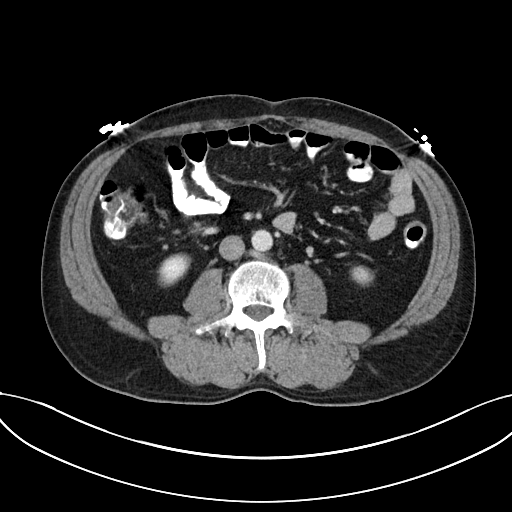
[im 58/97  soft-tissue]
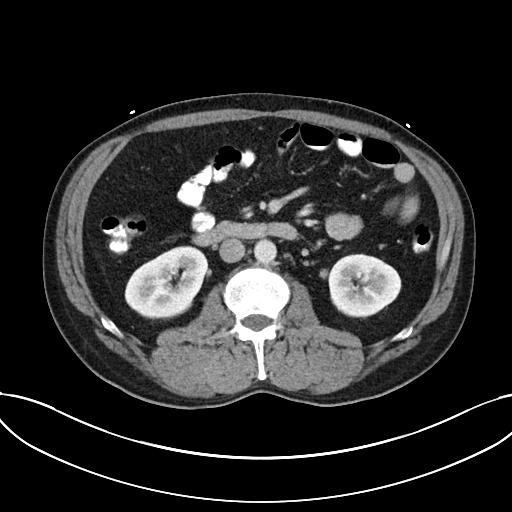
[im 65/97  soft-tissue]
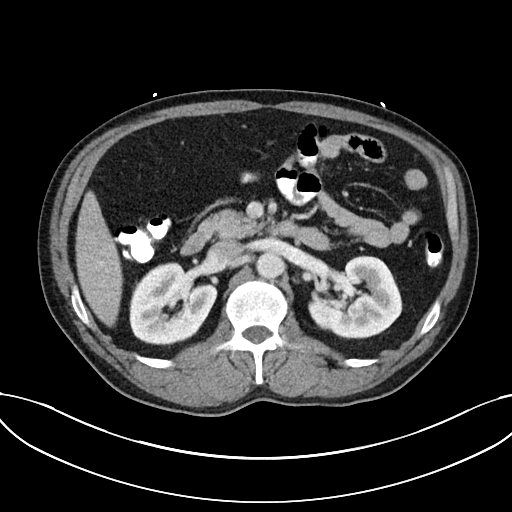
[im 65/97  bone]
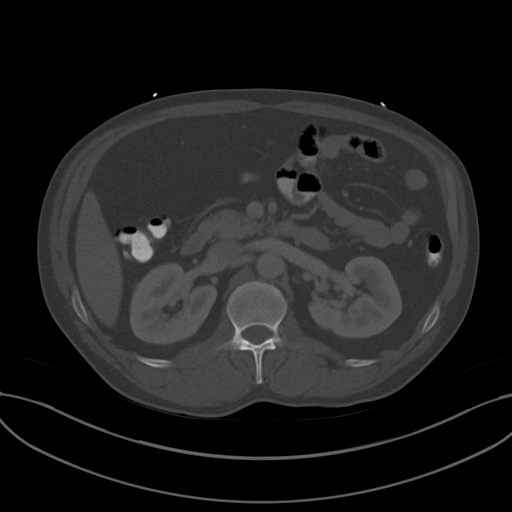
[im 71/97  soft-tissue]
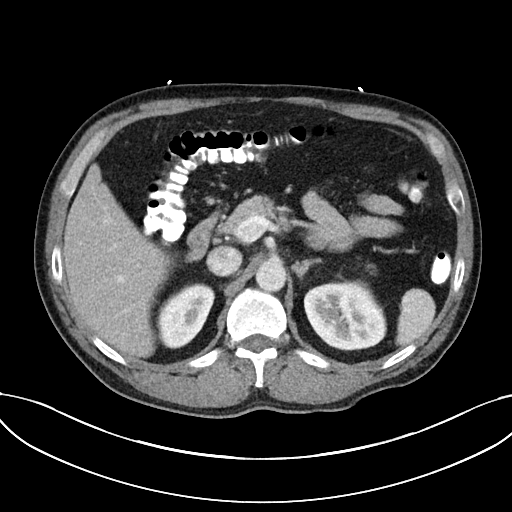
[im 77/97  soft-tissue]
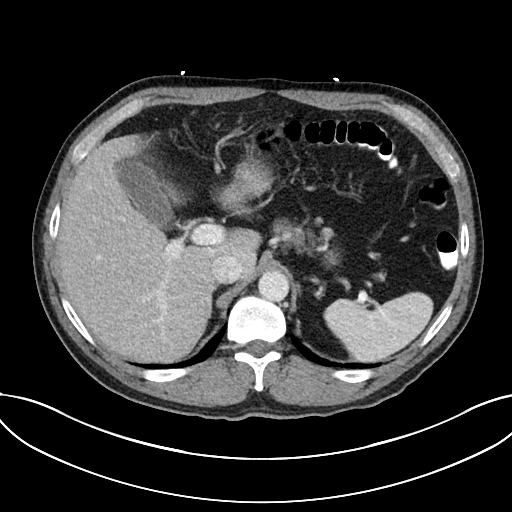
[im 84/97  soft-tissue]
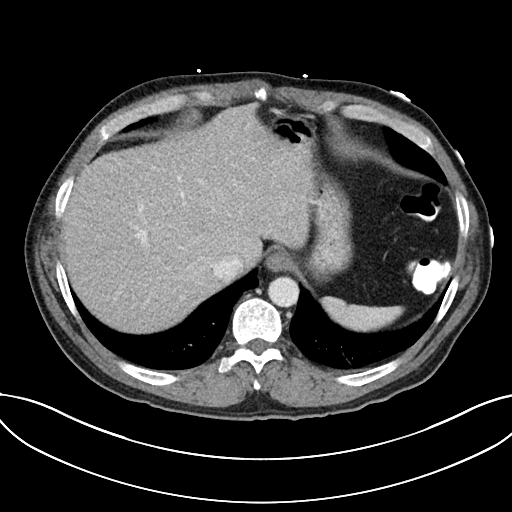
[im 90/97  soft-tissue]
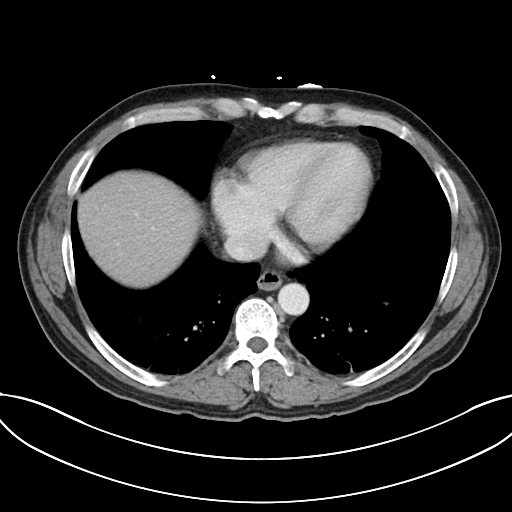

[Series 6: abdomen 3.0 mpr cor · coronal · 0.76mm/px · 3 of 80 slices shown]
[im 27/80  soft-tissue]
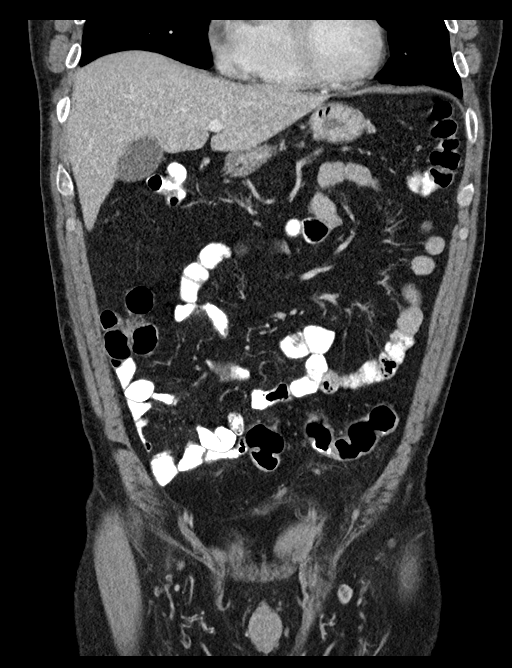
[im 36/80  soft-tissue]
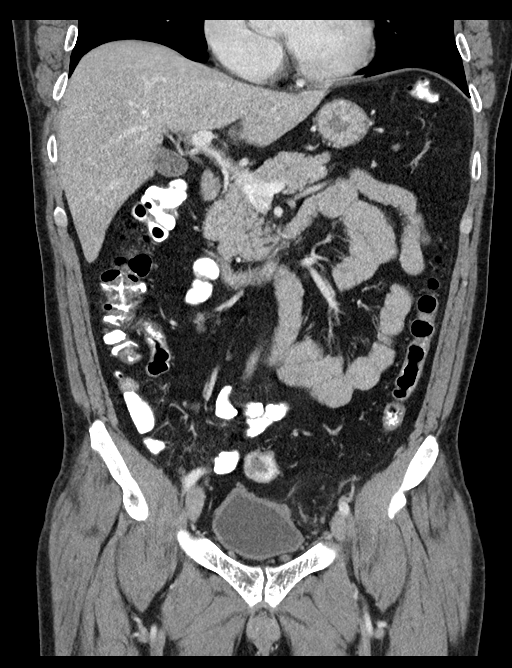
[im 44/80  soft-tissue]
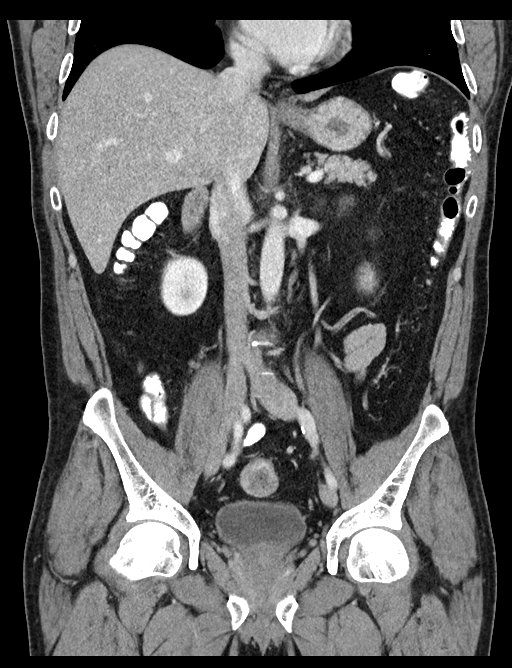

[16 of 46 positions shown; findings below may reference images not displayed]

FINDINGS: Lower chest: Dependent atelectasis.

Hepatobiliary: Diffuse hepatic steatosis.  Normal gallbladder.

Pancreas: Unremarkable

Spleen: Unremarkable

Adrenals/Urinary Tract: Adrenal glands and kidneys are unremarkable.
Mild bladder wall thickening is diffuse. The bladder is relatively
decompressed.

Stomach/Bowel: Normal appendix. No obvious mass in the colon. There
is wall thickening of the distal sigmoid common without adjacent
inflammatory changes which may related to decompression. No evidence
of small-bowel obstruction.

Vascular/Lymphatic: No abnormal retroperitoneal adenopathy or aortic
aneurysm. Circumaortic left renal vein anatomy.

Reproductive: Normal prostate.

Other: Left inguinal hernia contains adipose tissue. There is
stranding within the hernia sac. There is also stranding within the
fat in the left lower quadrant adjacent to the hernia sac. These
findings suggest an element of an inflammatory process. The closest
segment of large bowel has a normal appearance without evidence of
inflammation.

Musculoskeletal: No vertebral compression.
IMPRESSION: Left inguinal hernia contains adipose tissue. There is fatty
stranding within the fat in the hernia sac and fat extending towards
the hernia sac. An inflammatory process or vascular compromise
cannot be excluded such as strangulated hernia.

There is diffuse wall thickening of the bladder as well as mild wall
thickening of the distal sigmoid colon. An inflammatory process is
not excluded. These findings may simply reflect decompression.
